# Patient Record
Sex: Male | Born: 1975 | Race: White | Hispanic: No | Smoking: Current some day smoker
Health system: Southern US, Community
[De-identification: ages and names within clinical notes are randomized; demographics above are authoritative.]

## PROBLEM LIST (undated history)

## (undated) DIAGNOSIS — F1020 Alcohol dependence, uncomplicated: Secondary | ICD-10-CM

## (undated) DIAGNOSIS — F429 Obsessive-compulsive disorder, unspecified: Secondary | ICD-10-CM

---

## 2017-11-20 ENCOUNTER — Emergency Department (HOSPITAL_COMMUNITY): Payer: Self-pay

## 2017-11-20 ENCOUNTER — Encounter (HOSPITAL_COMMUNITY): Payer: Self-pay

## 2017-11-20 ENCOUNTER — Emergency Department (HOSPITAL_COMMUNITY)
Admission: EM | Admit: 2017-11-20 | Discharge: 2017-11-20 | Disposition: A | Discharge status: Home / Self Care | Payer: Self-pay | Attending: Emergency Medicine | Admitting: Emergency Medicine

## 2017-11-20 ENCOUNTER — Other Ambulatory Visit: Payer: Self-pay

## 2017-11-20 DIAGNOSIS — R52 Pain, unspecified: Secondary | ICD-10-CM

## 2017-11-20 DIAGNOSIS — R079 Chest pain, unspecified: Secondary | ICD-10-CM | POA: Insufficient documentation

## 2017-11-20 DIAGNOSIS — F17290 Nicotine dependence, other tobacco product, uncomplicated: Secondary | ICD-10-CM | POA: Insufficient documentation

## 2017-11-20 DIAGNOSIS — F99 Mental disorder, not otherwise specified: Secondary | ICD-10-CM | POA: Insufficient documentation

## 2017-11-20 DIAGNOSIS — Z79899 Other long term (current) drug therapy: Secondary | ICD-10-CM | POA: Insufficient documentation

## 2017-11-20 HISTORY — DX: Obsessive-compulsive disorder, unspecified: F42.9

## 2017-11-20 LAB — RAPID URINE DRUG SCREEN, HOSP PERFORMED
AMPHETAMINES: NOT DETECTED
Barbiturates: NOT DETECTED
Benzodiazepines: NOT DETECTED
Cocaine: NOT DETECTED
OPIATES: NOT DETECTED
Tetrahydrocannabinol: NOT DETECTED

## 2017-11-20 LAB — CBC
HCT: 45.2 % (ref 39.0–52.0)
Hemoglobin: 16.2 g/dL (ref 13.0–17.0)
MCH: 31.4 pg (ref 26.0–34.0)
MCHC: 35.8 g/dL (ref 30.0–36.0)
MCV: 87.6 fL (ref 78.0–100.0)
PLATELETS: 200 10*3/uL (ref 150–400)
RBC: 5.16 MIL/uL (ref 4.22–5.81)
RDW: 12.2 % (ref 11.5–15.5)
WBC: 5.8 10*3/uL (ref 4.0–10.5)

## 2017-11-20 LAB — COMPREHENSIVE METABOLIC PANEL
ALK PHOS: 73 U/L (ref 38–126)
ALT: 57 U/L (ref 17–63)
ANION GAP: 11 (ref 5–15)
AST: 69 U/L — ABNORMAL HIGH (ref 15–41)
Albumin: 5 g/dL (ref 3.5–5.0)
BUN: 12 mg/dL (ref 6–20)
CALCIUM: 9.7 mg/dL (ref 8.9–10.3)
CO2: 25 mmol/L (ref 22–32)
Chloride: 101 mmol/L (ref 101–111)
Creatinine, Ser: 1.06 mg/dL (ref 0.61–1.24)
Glucose, Bld: 109 mg/dL — ABNORMAL HIGH (ref 65–99)
Potassium: 3.9 mmol/L (ref 3.5–5.1)
SODIUM: 137 mmol/L (ref 135–145)
TOTAL PROTEIN: 8 g/dL (ref 6.5–8.1)
Total Bilirubin: 1 mg/dL (ref 0.3–1.2)

## 2017-11-20 LAB — ETHANOL: Alcohol, Ethyl (B): <10 mg/dL (ref <10)

## 2017-11-20 LAB — I-STAT TROPONIN, ED
TROPONIN I, POC: 0 ng/mL (ref 0.00–0.08)
Troponin i, poc: 0 ng/mL (ref 0.00–0.08)

## 2017-11-20 LAB — ACETAMINOPHEN LEVEL

## 2017-11-20 LAB — SALICYLATE LEVEL

## 2017-11-20 MED ORDER — LORAZEPAM 1 MG PO TABS: 1.0000 mg | ORAL_TABLET | Freq: Once | ORAL | Status: DC

## 2017-11-20 MED ORDER — GI COCKTAIL ~~LOC~~
30.0000 mL | Freq: Once | ORAL | Status: AC
  Administered 2017-11-20: 30 mL via ORAL
  Filled 2017-11-20 (×2): qty 30

## 2017-11-20 NOTE — ED Notes (Signed)
Pt is requesting to leave. 

## 2017-11-20 NOTE — ED Notes (Signed)
Bed: [facility]4 Expected date:  Expected time:  Means of arrival:  Comments: 

## 2017-11-20 NOTE — ED Notes (Signed)
Bed: [facility]1 Expected date:  Expected time:  Means of arrival:  Comments: 

## 2017-11-20 NOTE — ED Notes (Signed)
Called twice with no answer.

## 2017-11-20 NOTE — ED Provider Notes (Signed)
[facility]-EMERGENCY DEPT Provider Note   CSN: [id number] Arrival date & time: 11/20/17  1229     History   Chief Complaint Chief Complaint  Patient presents with  . Chest Pain  . Paranoid  . Psychiatric Evaluation    HPI Grant Lowery is a 41 y.o. male.  HPI 40 year old Caucasian past medical history significant for depression, OCD, anorexia, alcohol abuse that presents to the emergency department today for evaluation of paranoia and chest pain.  The patient states that over the past several days he is "having a psychotic break".  The patient states that he has not been able to sleep.  He has had poor appetite however this is consistent with his anorexia.  Patient also states that he is convinced that there is something medically wrong with him. He wants to make sure that he does not have cancer or liver cirrhosis.  Patient is paranoid that he is going to die.  Patient does have a history of alcohol abuse however states that he has no recent alcohol use.  According to patient's notes in care everywhere in 2016 he was at a facility for 30 days for inpatient treatment of alcohol abuse.  In 2017 he spent 6 weeks at [facility] in [place] for treatment of eating disorder OCD and substance abuse, in the beginning of 2018 he was seen locally [facility].  Patient reports tobacco use.  Denies any other drug use or alcohol use at this time. States that he is seeing stars on the wall.  States that he is living in 2 dimensions one is the regular world and one is his anorexic world.  Patient states that he is on Vivitrol injections and injectable anorexic medication and was recently placed on Wellbutrin yesterday.  States that he took 1 dose of the Wellbutrin and that he felt this way.  However his friend at bedside states that patient for the past month has been feeling paranoid.  Patient states that he does see a psychiatrist in [place] and they recently started a new  regimen and thinks that this may be what is wrong with him.  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  Patient is aware that he may lose his job. He admitted to working today for 2 hours and feeling like people were talking about him and thinking he was crazy, so he left work early  Patient denies any SI or HI behavior. He denies any other drug use or alcohol use at this time.  Patient also complains of generalized chest pain that started yesterday.  Denies any associated shortness of breath, diaphoresis, nausea, emesis.  The pain is substernal.  Does not radiate.  He has not training for the pain prior to arrival.  Patient denies any cardiac history.  Denies any significant cardiac history.  Denies any history of hypertension or diabetes.  Patient also denies any history of DVT/PE, prolonged immobilization, recent hospitalizations or surgeries, unilateral leg swelling or calf tenderness, hormone use, hemoptysis, history of cancer.  Chest pain is not exertional.  Nothing makes better or worse.  Chest pain is nonpleuritic.  Pt denies any fever, chill, ha, vision changes, lightheadedness, dizziness, congestion, neck pain, sob, cough, abd pain, n/v/d, urinary symptoms, change in bowel habits, melena, hematochezia, lower extremity paresthesias.  Past Medical History:  Diagnosis Date  . OCD (obsessive compulsive disorder)     There are no active problems to display for this patient.  History reviewed. No pertinent surgical history.     Home Medications    Prior to Admission medications   Not on File    Family History Family History  Problem Relation Age of Onset  . Cancer Father     Social History Social History   Tobacco Use  . Smoking status: Current Some Day Smoker    Types: Cigars  . Smokeless tobacco: Never Used  Substance Use Topics  . Alcohol use: No    Frequency: Never  . Drug use: No     Allergies   Patient has no known allergies.   Review  of Systems Review of Systems  Constitutional: Negative for chills and fever.  HENT: Negative for congestion and sore throat.   Eyes: Negative for visual disturbance.  Respiratory: Negative for cough and shortness of breath.   Cardiovascular: Positive for chest pain. Negative for palpitations and leg swelling.  Gastrointestinal: Negative for abdominal pain, diarrhea, nausea and vomiting.  Genitourinary: Negative for dysuria, flank pain, frequency, hematuria, scrotal swelling, testicular pain and urgency.  Musculoskeletal: Negative for arthralgias and myalgias.  Skin: Negative for rash.  Neurological: Negative for dizziness, syncope, weakness, light-headedness, numbness and headaches.  Psychiatric/Behavioral: Positive for agitation, behavioral problems and hallucinations. Negative for sleep disturbance. The patient is nervous/anxious.      Physical Exam Updated Vital Signs BP (!) 157/92 (BP Location: Left Arm)   Pulse 86   Temp 98.5 F (36.9 C) (Oral)   Resp 16   Ht 5\' 10"  (1.778 m)   Wt 72.1 kg (159 lb)   SpO2 100%   BMI 22.81 kg/m   Physical Exam  Constitutional: He is oriented to person, place, and time. He appears well-developed and well-nourished.  Non-toxic appearance. No distress.  HENT:  Head: Normocephalic and atraumatic.  Nose: Nose normal.  Mouth/Throat: Oropharynx is clear and moist.  Eyes: Conjunctivae and EOM are normal. Pupils are equal, round, and reactive to light. Right eye exhibits no discharge. Left eye exhibits no discharge.  Neck: Normal range of motion. Neck supple. No JVD present. No tracheal deviation present.  Cardiovascular: Normal rate, regular rhythm, normal heart sounds and intact distal pulses. Exam reveals no gallop and no friction rub.  No murmur heard. Pulmonary/Chest: Effort normal and breath sounds normal. No stridor. No respiratory distress. He has no wheezes. He has no rales. He exhibits no tenderness.  No hypoxia or tachypnea.  Abdominal:  Soft. Bowel sounds are normal. He exhibits no distension. There is no tenderness. There is no rebound and no guarding.  Musculoskeletal: Normal range of motion.  No lower extremity edema or calf tenderness.  Lymphadenopathy:    He has no cervical adenopathy.  Neurological: He is alert and oriented to person, place, and time.  Strength equal in all extremities.  Alert and oriented x3.  Follows commands appropriately.  Speech is normal.  Skin: Skin is warm and dry. Capillary refill takes less than 2 seconds. He is not diaphoretic.  Psychiatric: Judgment normal. His mood appears anxious. His speech is rapid and/or pressured. He is agitated. Thought content is paranoid. Cognition and memory are normal. He exhibits a depressed mood. He expresses no homicidal and no suicidal ideation. He expresses no suicidal plans and no homicidal plans.  Pt seems anxious with flight of ideas. Denies si or hi. No actively at responding to internal stimuli.  Nursing note and vitals reviewed.    ED Treatments / Results  Labs (all labs ordered are listed, but only abnormal results  are displayed) Labs Reviewed  COMPREHENSIVE METABOLIC PANEL - Abnormal; Notable for the following components:      Result Value   Glucose, Bld 109 (*)    AST 69 (*)    All other components within normal limits  ACETAMINOPHEN LEVEL - Abnormal; Notable for the following components:   Acetaminophen (Tylenol), Serum <10 (*)    All other components within normal limits  CBC  SALICYLATE LEVEL  RAPID URINE DRUG SCREEN, HOSP PERFORMED  ETHANOL  I-STAT TROPONIN, ED  I-STAT TROPONIN, ED    EKG  EKG Interpretation  Date/Time:  Wednesday November 20 2017 13:11:05 EST Ventricular Rate:  92 PR Interval:    QRS Duration: 106 QT Interval:  369 QTC Calculation: 457 R Axis:   53 Text Interpretation:  Sinus rhythm Consider right atrial enlargement Borderline T wave abnormalities Minimal ST elevation, anterior leads Baseline wander in  lead(s) V2 Confirmed by Lorre Nick ([id number]) on 11/20/2017 6:27:46 PM       Radiology Dg Chest 2 View  Result Date: 11/20/2017 CLINICAL DATA:  Intermittent bilateral in midchest pain associated with nausea since last night. No shortness of breath or vomiting. EXAM: CHEST  2 VIEW COMPARISON:  Chest x-ray of November 20 2017 included with a rib series. FINDINGS: The lungs are well-expanded. The lung markings are coarse in the right infrahilar region which appears to correspond with increased density in the retrocardiac region. The left lung is clear. The heart and pulmonary vascularity are normal. The bony thorax and observed portions of the upper abdomen are normal. IMPRESSION: Coarse lung markings in the right lower lobe may reflect atelectasis or early pneumonia. Followup PA and lateral chest X-ray is recommended in 3-4 weeks following trial of antibiotic therapy to ensure resolution and exclude underlying malignancy. (The previously dictated right rib series included a chest x-ray on which the findings in the right infrahilar region were not as prominent but with the density noted on the lateral view in the right lower lobe, an infiltrate is felt likely.) Electronically Signed   By: David  [place] M.D.   On: 11/20/2017 14:41   Dg Ribs Unilateral Left  Result Date: 11/20/2017 CLINICAL DATA:  Intermittent chest discomfort with nausea since last night. No known injury. The patient reports difficulty taking a deep breath. EXAM: LEFT RIBS - 2 VIEW COMPARISON:  None in PACs FINDINGS: The lungs are well-expanded and clear. The heart and pulmonary vascularity are normal. There is no pleural effusion or pneumothorax. Left rib detail images reveal no acute or old rib fracture. The gas pattern in the upper abdomen is normal. IMPRESSION: There is no active cardiopulmonary disease. No abnormality of the left ribs is observed. Electronically Signed   By: David  [place] M.D.   On: 11/20/2017 14:24     Procedures Procedures (including critical care time)  Medications Ordered in ED Medications - No data to display   Initial Impression / Assessment and Plan / ED Course  I have reviewed the triage vital signs and the nursing notes.  Pertinent labs & imaging results that were available during my care of the patient were reviewed by me and considered in my medical decision making (see chart for details).     Patient presents to the ED for psychiatric evaluation and.  Patient has no cardiac history.  Does have a history of anorexia, OCD, depression that is followed by a psychiatrist.  Recently changed to different medications including Wellbutrin.  Patient was at work today and thought he was  having a psychotic break.  Friend at bedside states that patient has not been sleeping over the past week.  He has been staying with his friend in [place].  Patient recently started a new job.  On exam patient is overall well-appearing and nontoxic.  Vital signs are reassuring.  Patient does appear anxious with flight of ideas.  On exam patient has clear lungs to auscultation.  Heart regular rate and rhythm with no rubs murmurs or gallops.  Abdominal exam is benign.  Patient nerve.  No focal neuro deficit.  Patient is alert oriented x3.  Adamantly denies SI or HI behavior.   Lab work has been very reassuring.  No leukocytosis.  Electro lites are reassuring.  Mild elevation in AST of 69 with history of same.  Correlates with patient's history of alcohol abuse.  Salicylate level, Tylenol level are normal.  UDS was unremarkable.  Ethanol level was normal.  EKG shows some nonspecific T wave changes but no signs of acute ischemia.  Negative delta troponin.  Chest x-ray shows concern for atelectasis versus developing infiltrate.  However patient denies any cough productive cough, cough, fevers and has no leukocytosis.  Low suspicion for pneumonia at this time.  Talked with patient we will not treat at this  time.  Will follow up with his primary care doctor or return to the ED if he develops any these symptoms will have repeat chest x-ray in 2 weeks.  Do not feel the patient's chest pain is cardiac or pulmonary in nature.  Patient's heart pathway score is 2.  Negative delta troponins.  Clinical presentation does not seem consistent with ACS.  Patient is PERC negative.  Low suspicion for PE.  Clinical presentation is concerning for pneumonia or dissection.  I did have TTS evaluate patient.  They discussed with patient that he could benefit from overnight stay with reevaluation by psychiatry in the morning.  The patient states that he does want to make sure that his blood work was okay.  States that he felt like he had cancer or liver cirrhosis.  States that he does not want to stay overnight.  TTS talk with psychiatry and did not feel that patient opposed to harm to himself.  I have re-spoken with patient.  He seems more calm at this time.  Discussed all of his blood work are reassuring.  Patient states so I do not have cancer.  I discussed with patient that her blood work does not specifically test for cancer however when I seen so far his blood work has been reassuring.  He does have some mild liver elevation enzymes that is consistent with his alcohol usage.  I discussed the patient that he will need a yearly checkup with his primary care doctor.  Patient does have a friend at bedside who has been staying with patient in [place].  The patient also has a good support system with family in the area.  Patient also sees a psychiatrist in [place] and saw him yesterday when he was started on Wellbutrin.  Again discussed with patient and he adamantly denies SI or HI behavior.  I do not feel the patient poses a harm to himself.  Given that patient has good support system that can help patient out feel that patient is safe for discharge.  Appears significantly improved since prior assessment.  Discussed that he can  return to the ED at any time if he feels the symptoms again.  Patient states he feels much improved.  He discussed with me that he will call a psychiatrist in the morning to follow-up.  Pt is hemodynamically stable, in NAD, & able to ambulate in the ED. Evaluation does not show pathology that would require ongoing emergent intervention or inpatient treatment. I explained the diagnosis to the patient. Pain has been managed & has no complaints prior to dc. Pt is comfortable with above plan and is stable for discharge at this time. All questions were answered prior to disposition. Strict return precautions for f/u to the ED were discussed. Encouraged follow up with PCP.  Dicussed with my attending who is agreeable with the above plan.    Final Clinical Impressions(s) / ED Diagnoses   Final diagnoses:  Chest pain, unspecified type  Psychiatric disturbance    ED Discharge Orders    None       Wallace KellerLeaphart, Shonia Skilling T, PA-C 11/20/17 1934    Rolland PorterJames, Mark, MD 11/21/17 2354

## 2017-11-20 NOTE — Discharge Instructions (Signed)
Your lab work has been reassuring.  Your liver enzymes are elevated however it seems consistent with prior test.  This is from drinking.  We will need to have this followed up with the primary care doctor.  Your chest x-ray shows a possible signs of pneumonia however you have no cough or fever.  Low suspicion for pneumonia.  If you develop a cough that is productive, develop a fever or worsening chest pains return to the ED or follow-up with the primary care doctor.  Have a repeat chest x-ray in 2-3 weeks.  It is very important that you follow-up with your psychiatrist.  If you develop any worsening symptoms make sure you return or have your friend bring it back to the ED for evaluation.

## 2017-11-20 NOTE — BH Assessment (Signed)
Assessment Note  Grant Lowery is a 41 y.o. male who presented voluntarily to ED due to feeling like he was having a psychotic break. Pt reported to the EDP that he was feeling like he was going to die (convinced that something medically was wrong with him) and that people were out to hurt him. Pt also reported having VH of stars on the wall. Pt denied any SI or HI.   During TTS assessment close to 4 hours later, pt presented as calm and cooperative. He admitted to feeling paranoid and thinking he had all sorts of medical issues going on. He admitted to working today for 2 hours and feeling like people were talking about him and thinking he was crazy, so he left work early. Pt shared that he started Wellbutrin a few days ago. No other changes identified. Pt sees a psychiatrist in [place] and reports being med compliant. Pt denied SI and HI. Pt admitted to seeing stars on the walls earlier in the day, but denied seeing them currently. Pt was not amenable to stay in the ED or an IP stay. Pt indicated that if his lab work and x-rays were good, he wanted to be d/c. He indicated that if he started to feel paranoid again, he would re-present to the ED.   Staffed case with Hillery Jacksanika Lewis, NP. It would be beneficial for pt to stay overnight and be observed for stability and be seen by the psychiatrist in the morning. However, pt does not want to stay and, at this point, does not pose a risk to himself or others. It is ultimately up to the EDP to determine if they want to pursue IVC. EDP Leaphart notified of disposition.   Diagnosis: OCD, by hx  Past Medical History:  Past Medical History:  Diagnosis Date  . OCD (obsessive compulsive disorder)     History reviewed. No pertinent surgical history.  Family History:  Family History  Problem Relation Age of Onset  . Cancer Father     Social History:  reports that he has been smoking cigars.  he has never used smokeless tobacco. He reports that he does not  drink alcohol or use drugs.  Additional Social History:  Alcohol / Drug Use Pain Medications: see PTA meds Prescriptions: see PTA meds Over the Counter: see PTA meds History of alcohol / drug use?: No history of alcohol / drug abuse  CIWA: CIWA-Ar BP: (!) 140/92 Pulse Rate: 75 COWS:    Allergies: No Known Allergies  Home Medications:  (Not in a hospital admission)  OB/GYN Status:  No LMP for male patient.  General Assessment Data [facility] of Assessment: [facility] TTS Assessment: In system Is this a Tele or Face-to-Face Assessment?: Face-to-Face Is this an Initial Assessment or a Re-assessment for this encounter?: Initial Assessment Marital status: Divorced Living Arrangements: Other (Comment)([facility]) Can pt return to current living arrangement?: Yes Admission Status: Voluntary Is patient capable of signing voluntary admission?: Yes Referral Source: Self/Family/Friend     Crisis Care Plan Living Arrangements: Other (Comment)([facility]) Name of Psychiatrist: a provider in [place]  Education Status Is patient currently in school?: No  Risk to self with the past 6 months Suicidal Ideation: No Has patient been a risk to self within the past 6 months prior to admission? : No Suicidal Intent: No Has patient had any suicidal intent within the past 6 months prior to admission? : No Is patient at risk for suicide?: No Suicidal Plan?: No Has patient had  any suicidal plan within the past 6 months prior to admission? : No Access to Means: No Previous Attempts/Gestures: No Intentional Self Injurious Behavior: None Family Suicide History: Unknown Recent stressful life event(s): Other (Comment) Persecutory voices/beliefs?: No Depression: No Substance abuse history and/or treatment for substance abuse?: No Suicide prevention information given to non-admitted patients: Not applicable  Risk to Others within the past 6 months Homicidal Ideation: No Does patient have any  lifetime risk of violence toward others beyond the six months prior to admission? : No Thoughts of Harm to Others: No Current Homicidal Intent: No Current Homicidal Plan: No Access to Homicidal Means: No History of harm to others?: No Assessment of Violence: None Noted Does patient have access to weapons?: No Criminal Charges Pending?: No Does patient have a court date: No Is patient on probation?: No  Psychosis Hallucinations: Visual Delusions: None noted  Mental Status Report Appearance/Hygiene: Unremarkable Eye Contact: Good Motor Activity: Unremarkable Speech: Logical/coherent Level of Consciousness: Alert Mood: Pleasant Affect: Blunted Anxiety Level: Minimal Thought Processes: Coherent, Relevant Judgement: Partial Orientation: Person, Place, Time, Situation Obsessive Compulsive Thoughts/Behaviors: Unable to Assess  Cognitive Functioning Concentration: Normal Memory: Recent Impaired, Remote Impaired IQ: Average Insight: see judgement above Impulse Control: Unable to Assess Appetite: Fair Sleep: Decreased Total Hours of Sleep: 4 Vegetative Symptoms: None  ADLScreening [facility] Assessment Services) Patient's cognitive ability adequate to safely complete daily activities?: Yes Patient able to express need for assistance with ADLs?: Yes Independently performs ADLs?: Yes (appropriate for developmental age)  Prior Inpatient Therapy Prior Inpatient Therapy: No  Prior Outpatient Therapy Prior Outpatient Therapy: No Does patient have an ACCT team?: No Does patient have Intensive In-House Services?  : No Does patient have [facility] services? : No Does patient have P4CC services?: No  ADL Screening (condition at time of admission) Patient's cognitive ability adequate to safely complete daily activities?: Yes Is the patient deaf or have difficulty hearing?: No Does the patient have difficulty seeing, even when wearing glasses/contacts?: No Does the patient have  difficulty concentrating, remembering, or making decisions?: No Patient able to express need for assistance with ADLs?: Yes Does the patient have difficulty dressing or bathing?: No Independently performs ADLs?: Yes (appropriate for developmental age) Does the patient have difficulty walking or climbing stairs?: No Weakness of Legs: None Weakness of Arms/Hands: None  Home Assistive Devices/Equipment Home Assistive Devices/Equipment: None    Abuse/Neglect Assessment (Assessment to be complete while patient is alone) Abuse/Neglect Assessment Can Be Completed: Yes Physical Abuse: Denies Verbal Abuse: Denies Sexual Abuse: Denies Exploitation of patient/patient's resources: Denies Self-Neglect: Denies Values / Beliefs Cultural Requests During Hospitalization: None Spiritual Requests During Hospitalization: None   Advance Directives (For Healthcare) Does Patient Have a Medical Advance Directive?: No Would patient like information on creating a medical advance directive?: No - Patient declined Nutrition Screen- [facility] Patient's home diet: Regular Has the patient recently lost weight without trying?: Yes, 14-23 lbs. Has the patient been eating poorly because of a decreased appetite?: Yes Malnutrition Screening Tool Score: 3  Additional Information 1:1 In Past 12 Months?: No CIRT Risk: No Elopement Risk: No Does patient have medical clearance?: Yes     Disposition:  Disposition Initial Assessment Completed for this Encounter: Yes(consulted with Hillery Jacksanika Lewis, NP) Disposition of Patient: Other dispositions, Re-evaluation by Psychiatry recommended(Overnight observation for stability recommended.)  On Site Evaluation by:   Reviewed with Physician:    Laddie AquasSamantha M Osmin Welz 11/20/2017 6:33 PM

## 2017-11-20 NOTE — ED Triage Notes (Signed)
Patient c/ointermittent mid chest pain and nausea since last night. Patient denies SOB, diaphoresis or vomiting.  Patient states he needs a psych evaluation because he is feeling paranoid and feels like he is going to die. Patient states he feels like he is having multiple personalities, but has never been diagnosed with that. Patient also c/o visual hallucinations like stars. Patient denies any auditory hallucinations. Patient denies any alcohol or drug use. Patient denies SI/HI

## 2017-11-20 NOTE — ED Notes (Signed)
TTS in room talking with pt.

## 2018-04-26 IMAGING — CR DG RIBS 2V*L*
5 series · 5 of 5 positions shown · non-contrast
Comparison: None in PACs

CLINICAL DATA: Intermittent chest discomfort with nausea since last
night. No known injury. The patient reports difficulty taking a deep
breath.

EXAM:
LEFT RIBS - 2 VIEW

[w chest pa]
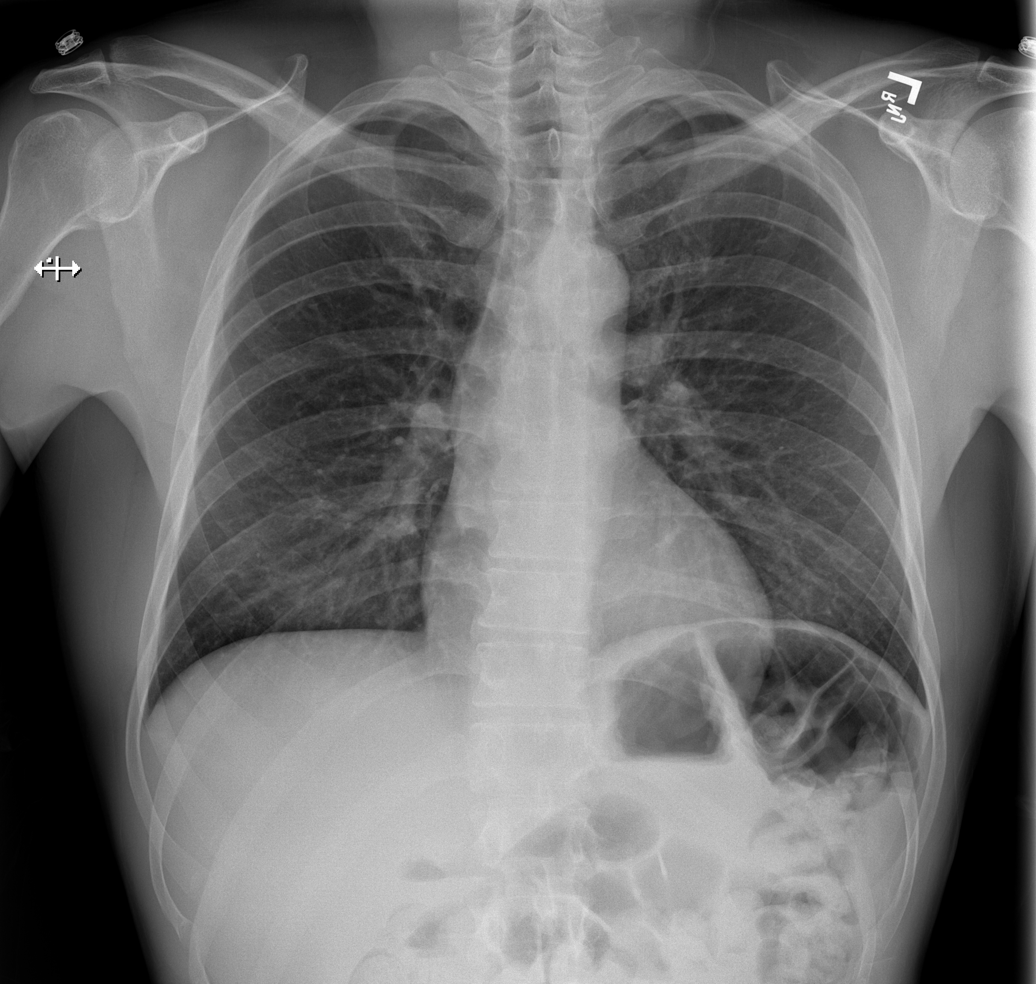

[w ribs ap upper left]
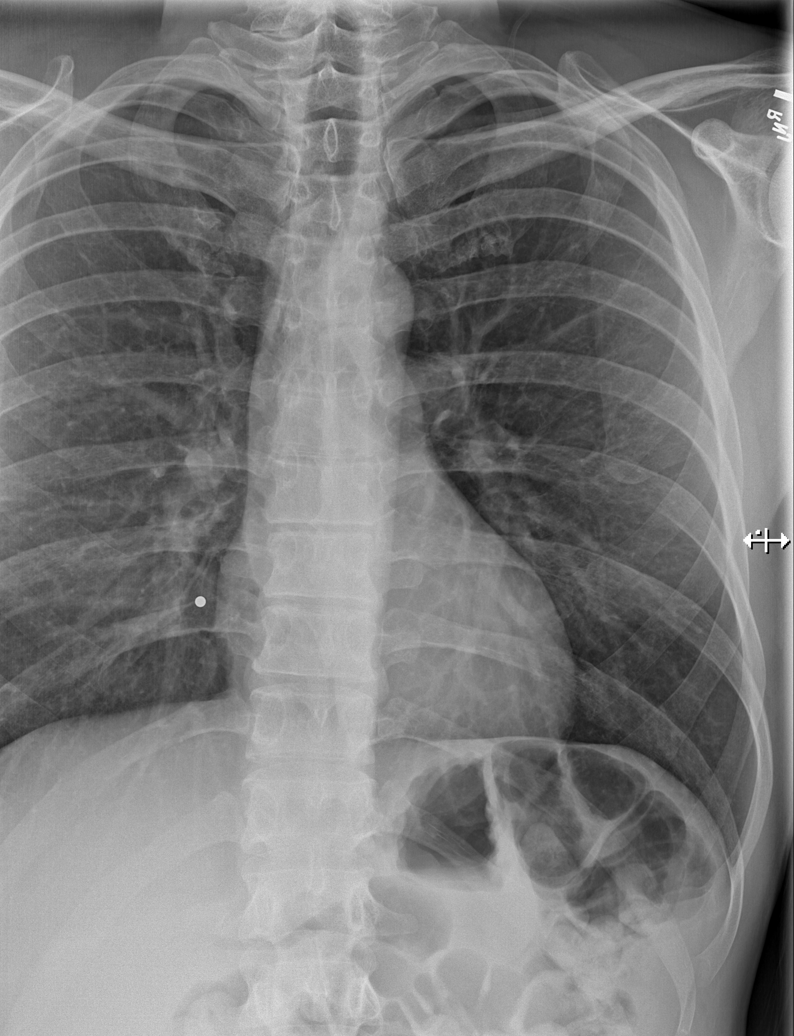

[w ribs ap lower left]
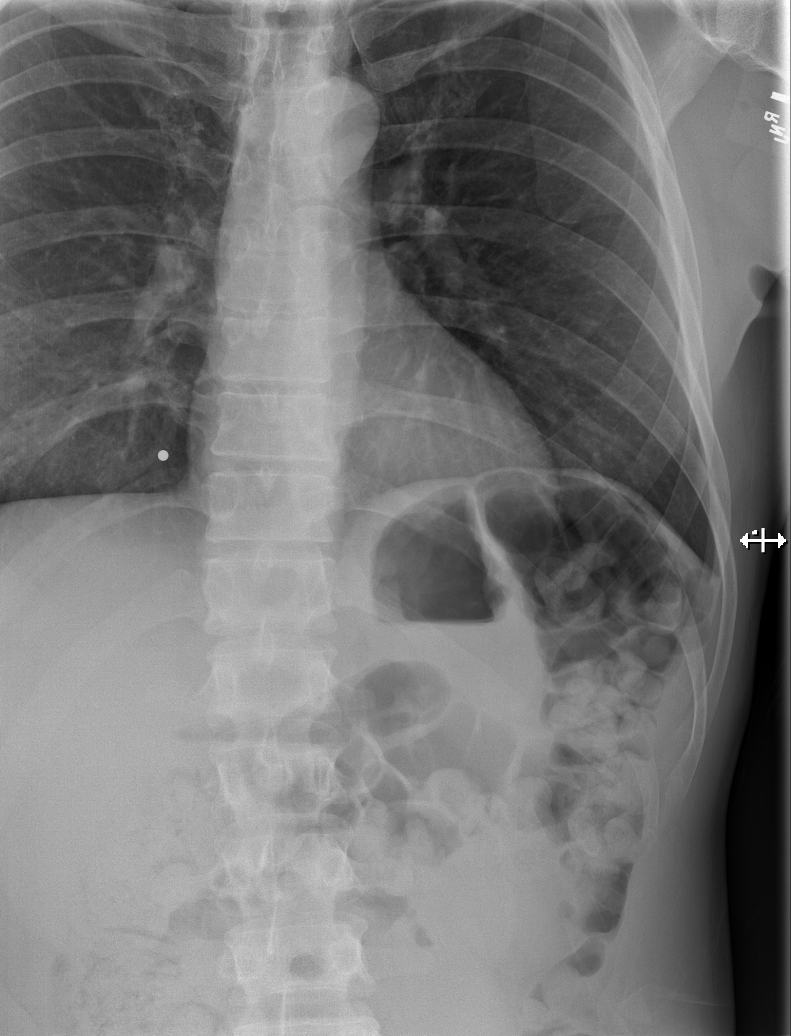

[w ribs obl left (1 of 2)]
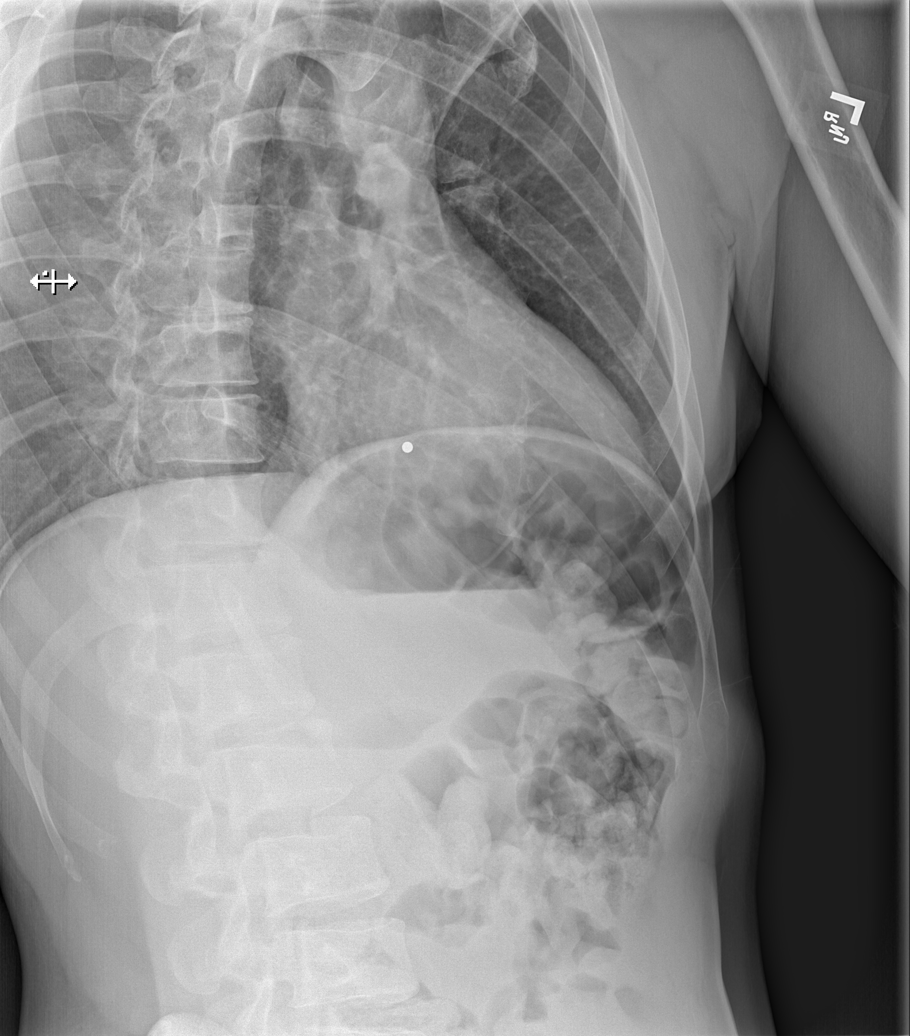

[w ribs obl left (2 of 2)]
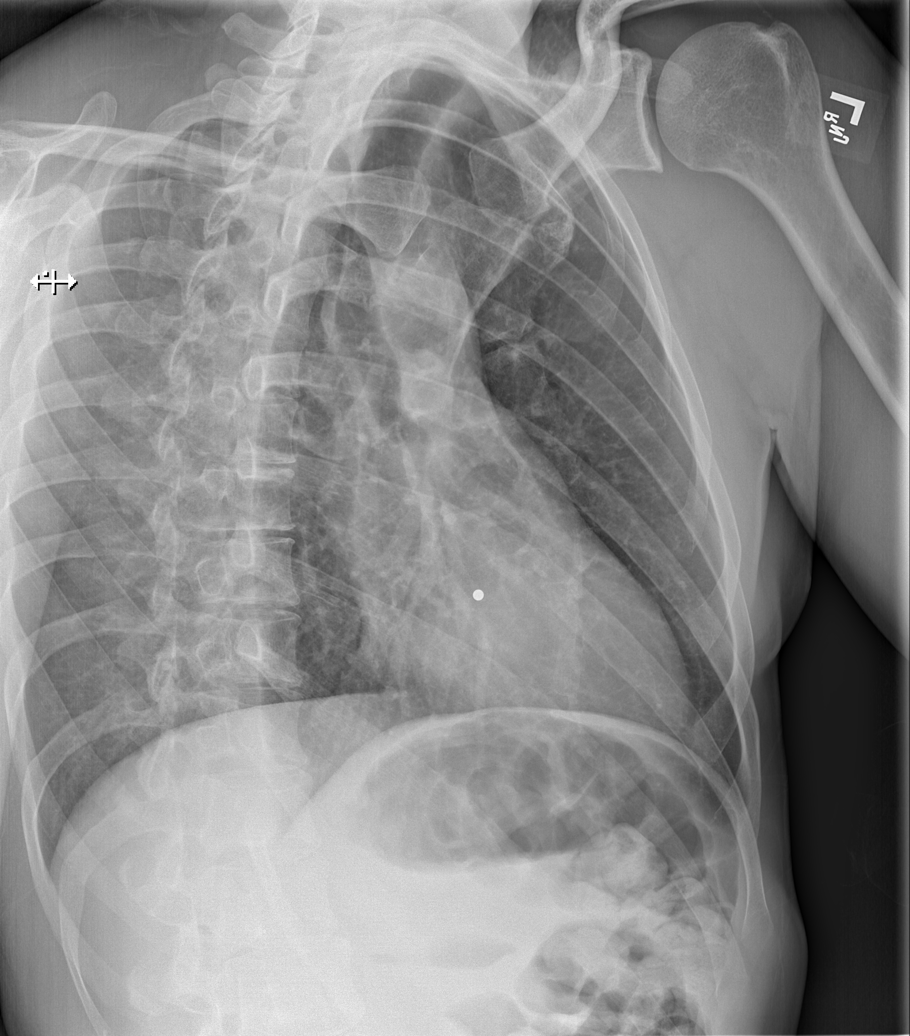

[5 of 5 positions shown; findings below may reference images not displayed]

FINDINGS: The lungs are well-expanded and clear. The heart and pulmonary
vascularity are normal. There is no pleural effusion or
pneumothorax. Left rib detail images reveal no acute or old rib
fracture. The gas pattern in the upper abdomen is normal.
IMPRESSION: There is no active cardiopulmonary disease. No abnormality of the
left ribs is observed.

## 2023-07-16 ENCOUNTER — Other Ambulatory Visit: Payer: Self-pay

## 2023-07-16 ENCOUNTER — Emergency Department (HOSPITAL_COMMUNITY)
Admission: EM | Admit: 2023-07-16 | Discharge: 2023-07-18 | Disposition: A | Discharge status: Other Acute Inpatient Hospital | Payer: BC Managed Care – PPO | Attending: Emergency Medicine | Admitting: Emergency Medicine

## 2023-07-16 ENCOUNTER — Encounter (HOSPITAL_COMMUNITY): Payer: Self-pay

## 2023-07-16 ENCOUNTER — Emergency Department (HOSPITAL_COMMUNITY): Payer: BC Managed Care – PPO

## 2023-07-16 DIAGNOSIS — F1020 Alcohol dependence, uncomplicated: Secondary | ICD-10-CM | POA: Diagnosis present

## 2023-07-16 DIAGNOSIS — R45851 Suicidal ideations: Secondary | ICD-10-CM | POA: Diagnosis not present

## 2023-07-16 HISTORY — DX: Alcohol dependence, uncomplicated: F10.20

## 2023-07-16 LAB — CBC WITH DIFFERENTIAL/PLATELET
Abs Immature Granulocytes: 0 10*3/uL (ref 0.00–0.07)
Basophils Absolute: 0 10*3/uL (ref 0.0–0.1)
Basophils Relative: 1 %
Eosinophils Absolute: 0 10*3/uL (ref 0.0–0.5)
Eosinophils Relative: 0 %
HCT: 45.9 % (ref 39.0–52.0)
Hemoglobin: 15.4 g/dL (ref 13.0–17.0)
Immature Granulocytes: 0 %
Lymphocytes Relative: 31 %
Lymphs Abs: 1.1 10*3/uL (ref 0.7–4.0)
MCH: 30.1 pg (ref 26.0–34.0)
MCHC: 33.6 g/dL (ref 30.0–36.0)
MCV: 89.6 fL (ref 80.0–100.0)
Monocytes Absolute: 0.4 10*3/uL (ref 0.1–1.0)
Monocytes Relative: 11 %
Neutro Abs: 2.1 10*3/uL (ref 1.7–7.7)
Neutrophils Relative %: 57 %
Platelets: 186 10*3/uL (ref 150–400)
RBC: 5.12 MIL/uL (ref 4.22–5.81)
RDW: 12.1 % (ref 11.5–15.5)
WBC: 3.7 10*3/uL — ABNORMAL LOW (ref 4.0–10.5)
nRBC: 0 % (ref 0.0–0.2)

## 2023-07-16 LAB — RAPID URINE DRUG SCREEN, HOSP PERFORMED
Amphetamines: NOT DETECTED
Barbiturates: NOT DETECTED
Benzodiazepines: POSITIVE — AB
Cocaine: NOT DETECTED
Opiates: NOT DETECTED
Tetrahydrocannabinol: NOT DETECTED

## 2023-07-16 LAB — ETHANOL: Alcohol, Ethyl (B): <10 mg/dL (ref <10)

## 2023-07-16 LAB — COMPREHENSIVE METABOLIC PANEL
ALT: 19 U/L (ref 0–44)
AST: 21 U/L (ref 15–41)
Albumin: 4.9 g/dL (ref 3.5–5.0)
Alkaline Phosphatase: 49 U/L (ref 38–126)
Anion gap: 7 (ref 5–15)
BUN: 13 mg/dL (ref 6–20)
CO2: 29 mmol/L (ref 22–32)
Calcium: 9.3 mg/dL (ref 8.9–10.3)
Chloride: 97 mmol/L — ABNORMAL LOW (ref 98–111)
Creatinine, Ser: 1.02 mg/dL (ref 0.61–1.24)
GFR, Estimated: >60 mL/min (ref >60)
Glucose, Bld: 92 mg/dL (ref 70–99)
Potassium: 3.9 mmol/L (ref 3.5–5.1)
Sodium: 133 mmol/L — ABNORMAL LOW (ref 135–145)
Total Bilirubin: 1.1 mg/dL (ref 0.3–1.2)
Total Protein: 7.3 g/dL (ref 6.5–8.1)

## 2023-07-16 MED ORDER — FLUVOXAMINE MALEATE 50 MG PO TABS
50.0000 mg | ORAL_TABLET | Freq: Three times a day (TID) | ORAL | Status: DC
  Administered 2023-07-16 – 2023-07-17 (×5): 50 mg via ORAL
  Filled 2023-07-16 (×6): qty 1

## 2023-07-16 MED ORDER — QUETIAPINE FUMARATE 100 MG PO TABS
100.0000 mg | ORAL_TABLET | Freq: Every evening | ORAL | Status: DC | PRN
  Administered 2023-07-16 – 2023-07-17 (×2): 100 mg via ORAL
  Filled 2023-07-16 (×2): qty 1

## 2023-07-16 MED ORDER — NAPROXEN 500 MG PO TABS
500.0000 mg | ORAL_TABLET | Freq: Two times a day (BID) | ORAL | Status: DC
  Administered 2023-07-16 – 2023-07-17 (×3): 500 mg via ORAL
  Filled 2023-07-16 (×3): qty 1

## 2023-07-16 MED ORDER — HYDROXYZINE HCL 10 MG PO TABS
10.0000 mg | ORAL_TABLET | Freq: Three times a day (TID) | ORAL | Status: DC | PRN
  Administered 2023-07-16 – 2023-07-17 (×2): 10 mg via ORAL
  Filled 2023-07-16 (×2): qty 1

## 2023-07-16 MED ORDER — ACETAMINOPHEN 325 MG PO TABS
650.0000 mg | ORAL_TABLET | Freq: Three times a day (TID) | ORAL | Status: DC | PRN
  Administered 2023-07-17: 650 mg via ORAL
  Filled 2023-07-16: qty 2

## 2023-07-16 MED ORDER — THIAMINE MONONITRATE 100 MG PO TABS
100.0000 mg | ORAL_TABLET | Freq: Every day | ORAL | Status: DC
  Administered 2023-07-16 – 2023-07-17 (×2): 100 mg via ORAL
  Filled 2023-07-16 (×2): qty 1

## 2023-07-16 NOTE — ED Notes (Signed)
Pt noted by officer to be licking hand sanitizer out of his hand prior to entering bathroom. Pt educated on expected behavior and ability to consult with provider if medications would possibly be indicated for any treatment or s/s.

## 2023-07-16 NOTE — ED Provider Notes (Signed)
[place] EMERGENCY DEPARTMENT AT [facility] Provider Note   CSN: [id number] Arrival date & time: 07/16/23  1102     History  Chief Complaint  Patient presents with   IVC, suicidal    Grant Lowery is a 47 y.o. male.  47 year old male here today for reported suicidal ideation at his inpatient alcohol [facility] tox facility.  Patient reportedly was asked if he was having thoughts of self-harm and the patient said that he was at that time.  Patient says that he sometimes has thoughts of self-harm.  He is also complaining of chronic pain in his left ankle.  Patient has not had alcohol for more than 10 days.        Home Medications Prior to Admission medications   Medication Sig Start Date End Date Taking? Authorizing Provider  fluvoxaMINE (LUVOX) 50 MG tablet Take 50 mg by mouth 3 (three) times daily. 07/15/23  Yes [provider]  GNP PAIN RELIEF 325 MG tablet Take 650 mg by mouth 3 (three) times daily as needed. 07/10/23  Yes [provider]  naproxen (NAPROSYN) 500 MG tablet SMARTSIG:1 Tablet(s) By Mouth Morning-Night 07/12/23  Yes [provider]  QUEtiapine (SEROQUEL) 100 MG tablet Take 100 mg by mouth at bedtime as needed. 07/12/23  Yes [provider]  thiamine (VITAMIN B1) 100 MG tablet Take 1 tablet by mouth daily. 11/22/17  Yes [provider]  buPROPion (WELLBUTRIN SR) 150 MG 12 hr tablet Take 150 mg by mouth 2 (two) times daily.    [provider]  cariprazine (VRAYLAR) capsule Take 3 mg by mouth daily.    [provider]  FLUoxetine (PROZAC) 40 MG capsule Take 40 mg by mouth daily. 03/07/17   [provider]  levothyroxine (LEVOXYL) 150 MCG tablet Take 150 mcg by mouth 2 (two) times daily.    [provider]  Multiple Vitamin (MULTIVITAMIN WITH MINERALS) TABS tablet Take 1 tablet by mouth daily.    [provider]  Naltrexone (VIVITROL IM) Inject 1 Dose into the muscle once.     [provider]      Allergies    Patient has no known allergies.    Review of Systems   Review of Systems  Physical Exam Updated Vital Signs BP (!) 142/103 (BP Location: Left Arm)   Pulse 62   Temp 98.1 F (36.7 C) (Oral)   Resp 18   Ht 5\' 10"  (1.778 m)   Wt 75.8 kg   SpO2 100%   BMI 23.98 kg/m  Physical Exam Vitals reviewed.  Constitutional:      Appearance: Normal appearance.  Eyes:     Pupils: Pupils are equal, round, and reactive to light.  Cardiovascular:     Rate and Rhythm: Normal rate.  Pulmonary:     Effort: Pulmonary effort is normal.  Neurological:     General: No focal deficit present.     Mental Status: He is alert.  Psychiatric:        Mood and Affect: Mood normal.        Behavior: Behavior normal.        Thought Content: Thought content normal.     Comments: Patient endorses suicidal ideation without plan     ED Results / Procedures / Treatments   Labs (all labs ordered are listed, but only abnormal results are displayed) Labs Reviewed  COMPREHENSIVE METABOLIC PANEL - Abnormal; Notable for the following components:      Result Value  Sodium 133 (*)    Chloride 97 (*)    All other components within normal limits  RAPID URINE DRUG SCREEN, HOSP PERFORMED - Abnormal; Notable for the following components:   Benzodiazepines POSITIVE (*)    All other components within normal limits  CBC WITH DIFFERENTIAL/PLATELET - Abnormal; Notable for the following components:   WBC 3.7 (*)    All other components within normal limits  ETHANOL    EKG None  Radiology DG Ankle 2 Views Left  Result Date: 07/16/2023 CLINICAL DATA:  [facility] of [facility] ideations, being placed under an IVC via the rehab facility Dr. Rock Nephew also c/o lt ankle pain requesting imaging. Gait steady. EXAM: LEFT ANKLE - 2 VIEW COMPARISON:  None available FINDINGS: Multiple well-corticated ossific fragments of the tip of the medial and lateral  malleoli likely related to prior trauma. Advanced degenerative changes of the ankle. Pes planus deformity is partially visualized. No acute fracture or dislocation. Mild soft tissue swelling overlying the lateral malleolus. IMPRESSION: 1. Advanced degenerative changes of the ankle, likely sequelae of prior trauma. 2. Ankle joint effusion is present. Mild soft tissue swelling overlying the lateral malleolus. Electronically Signed   By: Acquanetta Belling M.D.   On: 07/16/2023 12:41    Procedures Procedures    Medications Ordered in ED Medications  hydrOXYzine (ATARAX) tablet 10 mg (10 mg Oral Given 07/16/23 1627)  fluvoxaMINE (LUVOX) tablet 50 mg (has no administration in time range)  QUEtiapine (SEROQUEL) tablet 100 mg (has no administration in time range)  thiamine (VITAMIN B1) tablet 100 mg (100 mg Oral Given 07/16/23 1627)  naproxen (NAPROSYN) tablet 500 mg (500 mg Oral Given 07/16/23 1627)  acetaminophen (TYLENOL) tablet 650 mg (has no administration in time range)    ED Course/ Medical Decision Making/ A&P                                 Medical Decision Making 47 year old male here today for suicidal ideation.  Plan-will medically clear the patient, will refer for TTS.  Patient states that he is not quite happy in the current detox facility that he is in, and is hoping for placement elsewhere.  Reassessment-patient is medically cleared.  Referred to TTS.  Amount and/or Complexity of Data Reviewed Labs: ordered. Radiology: ordered.  Risk OTC drugs. Prescription drug management.           Final Clinical Impression(s) / ED Diagnoses Final diagnoses:  Suicidal ideation    Rx / DC Orders ED Discharge Orders     None         Arletha Pili, DO 07/16/23 1628

## 2023-07-16 NOTE — ED Triage Notes (Addendum)
[facility] for reports of suicidal ideations, being placed under an IVC via the rehab facility Dr. Rock Nephew alo c/o lt ankle pain requesting imaging. Gait steady.

## 2023-07-16 NOTE — Consult Note (Signed)
BH ED ASSESSMENT   Reason for Consult:  Psychiatry evaluation note Referring Physician:  ER Physician Patient Identification: Grant Lowery MRN:  [id number] ED Chief Complaint: Alcohol use disorder, severe, dependence (HCC)  Diagnosis:  Principal Problem:   Alcohol use disorder, severe, dependence (HCC) Active Problems:   Suicide ideation   ED Assessment Time Calculation: Start Time: 1530 Stop Time: 1604 Total Time in Minutes (Assessment Completion): 34   Subjective:   Grant Lowery is a 47 y.o. male patient admitted with Previous Long  hx of Alcoholism, OCD and anxiety.brought in by [employer] under IVC from [facility] for reports of feeling suicidal.  Patient was at the [facility] for 10 days before this happened today.  He went in there to detox from Alcohol and Benzodiazepine.  While waiting to be seen Officer saw patient go in and out of the bathroom on few occasions and she caught patient using hand sanitizer licking his hand.  HPI:  Patient was seen by this provider this afternoon and he was calm and cooperative and engaged in meaningful conversation.He admitted long hx of alcoholism and added he has been to multiple Rehab facilities.  He also states he is addicted to the Benzodiazepine he is given for withdrawal symptoms.  He was IVC by the PA at [facility] when he voiced suicide ideation.  But here in the ER he states he was misunderstood.  He said he had told the PA that he gets suicidal ideation in the past off and on.  He denied he did not say at present he is suicidal.  Patient is homeless, unemployed, lost his house and car due to Alcoholism reported by Emergency planning/management officer who brought him.  Patient states he used to live in [place], [place]. His issue with alcohol started back in 2017 and he has been to many places and states for rehabilitation and treatment Patient currently is unemployed and homeless.  He is minimizing his drinking  issue and states that he plans to move to [place] to his sister's place or to his Parents in [place].  He takes Luvox for his OCD he says.  He was taking Seroquel for sleep but it appears he is out of that and the prescriber is in [place].  Patient reports that he sleeps well and appetite varies.  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 Caucasian male, 47 years old who appears younger than stated age was this this afternoon, came from [facility] for suicidal ideation.  He denies feeling suicidal and states he has no reason to be under IVC.  He already spent 10 days at the facility but here in the ER he was caught coming out of the bathroom licking his hand after multiple trips to the bathroom taking hand sanitizer. Call to his mother was not answered.  Call to [facility] was not answered.. Past Psychiatric History: Previous Long  hx of Alcoholism, OCD and anxiety.  Alcohol Rehab treatment utilized several states and facilities.  Patient denies inpatient Psychiatry hospitalization but has had enough rehab treatment.  We will resume his home Medications and seek bed placement.  Risk to Self or Others: Is the patient at risk to self? Yes Has the patient been a risk to self in the past 6 months? No Has the patient been a risk to self within the distant past? Yes Is the patient a risk to others? No Has the patient been a risk to others  in the past 6 months? No Has the patient been a risk to others within the distant past? No  [place] Scale:  Flowsheet Row ED from 07/16/2023 in [facility] Emergency Department at [facility]  C-SSRS RISK CATEGORY High Risk       AIMS:  , , ,  ,   ASAM:    Substance Abuse:     Past Medical History:  Past Medical History:  Diagnosis Date   OCD (obsessive compulsive disorder)    No past surgical history on file. Family History:  Family History  Problem Relation Age of Onset   Cancer Father    Family Psychiatric   History: denies Social History:  Social History   Substance and Sexual Activity  Alcohol Use No     Social History   Substance and Sexual Activity  Drug Use No    Social History   Socioeconomic History   Marital status: Divorced    Spouse name: Not on file   Number of children: Not on file   Years of education: Not on file   Highest education level: Not on file  Occupational History   Not on file  Tobacco Use   Smoking status: Some Days    Types: Cigars   Smokeless tobacco: Never  Vaping Use   Vaping status: Never Used  Substance and Sexual Activity   Alcohol use: No   Drug use: No   Sexual activity: Not on file  Other Topics Concern   Not on file  Social History Narrative   Not on file   Social Determinants of Health   Financial Resource Strain: Not on file  Food Insecurity: Not on file  Transportation Needs: Not on file  Physical Activity: Not on file  Stress: Not on file  Social Connections: Not on file   Additional Social History:    Allergies:  No Known Allergies  Labs:  Results for orders placed or performed during the hospital encounter of 07/16/23 (from the past 48 hour(s))  Comprehensive metabolic panel     Status: Abnormal   Collection Time: 07/16/23 12:20 PM  Result Value Ref Range   Sodium 133 (L) 135 - 145 mmol/L   Potassium 3.9 3.5 - 5.1 mmol/L   Chloride 97 (L) 98 - 111 mmol/L   CO2 29 22 - 32 mmol/L   Glucose, Bld 92 70 - 99 mg/dL    Comment: Glucose reference range applies only to samples taken after fasting for at least 8 hours.   BUN 13 6 - 20 mg/dL   Creatinine, Ser 0.86 0.61 - 1.24 mg/dL   Calcium 9.3 8.9 - 57.8 mg/dL   Total Protein 7.3 6.5 - 8.1 g/dL   Albumin 4.9 3.5 - 5.0 g/dL   AST 21 15 - 41 U/L   ALT 19 0 - 44 U/L   Alkaline Phosphatase 49 38 - 126 U/L   Total Bilirubin 1.1 0.3 - 1.2 mg/dL   GFR, Estimated >46 >96 mL/min    Comment: (NOTE) Calculated using the CKD-EPI Creatinine Equation (2021)    Anion gap 7 5 -  15    Comment: Performed at [facility], [address], [place], [place] [address]  Ethanol     Status: None   Collection Time: 07/16/23 12:20 PM  Result Value Ref Range   Alcohol, Ethyl (B) <10 <10 mg/dL    Comment: (NOTE) Lowest detectable limit for serum alcohol is 10 mg/dL.  For medical purposes only. Performed  at [facility], [address] [place], [place] [address]   CBC with Diff     Status: Abnormal   Collection Time: 07/16/23 12:20 PM  Result Value Ref Range   WBC 3.7 (L) 4.0 - 10.5 K/uL   RBC 5.12 4.22 - 5.81 MIL/uL   Hemoglobin 15.4 13.0 - 17.0 g/dL   HCT 60.4 54.0 - 98.1 %   MCV 89.6 80.0 - 100.0 fL   MCH 30.1 26.0 - 34.0 pg   MCHC 33.6 30.0 - 36.0 g/dL   RDW 19.1 47.8 - 29.5 %   Platelets 186 150 - 400 K/uL   nRBC 0.0 0.0 - 0.2 %   Neutrophils Relative % 57 %   Neutro Abs 2.1 1.7 - 7.7 K/uL   Lymphocytes Relative 31 %   Lymphs Abs 1.1 0.7 - 4.0 K/uL   Monocytes Relative 11 %   Monocytes Absolute 0.4 0.1 - 1.0 K/uL   Eosinophils Relative 0 %   Eosinophils Absolute 0.0 0.0 - 0.5 K/uL   Basophils Relative 1 %   Basophils Absolute 0.0 0.0 - 0.1 K/uL   Immature Granulocytes 0 %   Abs Immature Granulocytes 0.00 0.00 - 0.07 K/uL    Comment: Performed at [facility], [address] [place], [place] [address]  Urine rapid drug screen (hosp performed)     Status: Abnormal   Collection Time: 07/16/23 12:43 PM  Result Value Ref Range   Opiates NONE DETECTED NONE DETECTED   Cocaine NONE DETECTED NONE DETECTED   Benzodiazepines POSITIVE (A) NONE DETECTED   Amphetamines NONE DETECTED NONE DETECTED   Tetrahydrocannabinol NONE DETECTED NONE DETECTED   Barbiturates NONE DETECTED NONE DETECTED    Comment: (NOTE) DRUG SCREEN FOR MEDICAL PURPOSES ONLY.  IF CONFIRMATION IS NEEDED FOR ANY PURPOSE, NOTIFY LAB WITHIN 5 DAYS.  LOWEST DETECTABLE LIMITS FOR URINE DRUG SCREEN Drug Class                     Cutoff  (ng/mL) Amphetamine and metabolites    1000 Barbiturate and metabolites    200 Benzodiazepine                 200 Opiates and metabolites        300 Cocaine and metabolites        300 THC                            50 Performed at [facility], [address] [place], [place] [address]     Current Facility-Administered Medications  Medication Dose Route Frequency Provider Last Rate Last Admin   acetaminophen (TYLENOL) tablet 650 mg  650 mg Oral TID PRN Anders Simmonds T, DO       fluvoxaMINE (LUVOX) tablet 50 mg  50 mg Oral TID Anders Simmonds T, DO       hydrOXYzine (ATARAX) tablet 10 mg  10 mg Oral TID PRN Anders Simmonds T, DO       naproxen (NAPROSYN) tablet 500 mg  500 mg Oral BID WC Anders Simmonds T, DO       QUEtiapine (SEROQUEL) tablet 100 mg  100 mg Oral QHS PRN Anders Simmonds T, DO       thiamine (VITAMIN B1) tablet 100 mg  100 mg Oral Daily Arletha Pili, DO       Current Outpatient Medications  Medication Sig Dispense Refill   fluvoxaMINE (  LUVOX) 50 MG tablet Take 50 mg by mouth 3 (three) times daily.     GNP PAIN RELIEF 325 MG tablet Take 650 mg by mouth 3 (three) times daily as needed.     naproxen (NAPROSYN) 500 MG tablet SMARTSIG:1 Tablet(s) By Mouth Morning-Night     QUEtiapine (SEROQUEL) 100 MG tablet Take 100 mg by mouth at bedtime as needed.     thiamine (VITAMIN B1) 100 MG tablet Take 1 tablet by mouth daily.     buPROPion (WELLBUTRIN SR) 150 MG 12 hr tablet Take 150 mg by mouth 2 (two) times daily.     cariprazine (VRAYLAR) capsule Take 3 mg by mouth daily.     FLUoxetine (PROZAC) 40 MG capsule Take 40 mg by mouth daily.     levothyroxine (LEVOXYL) 150 MCG tablet Take 150 mcg by mouth 2 (two) times daily.     Multiple Vitamin (MULTIVITAMIN WITH MINERALS) TABS tablet Take 1 tablet by mouth daily.     Naltrexone (VIVITROL IM) Inject 1 Dose into the muscle once.      Musculoskeletal: Strength & Muscle Tone: within normal limits Gait &  Station: normal Patient leans: Front   Psychiatric Specialty Exam: Presentation  General Appearance:  Disheveled; Casual  Eye Contact: Good  Speech: Clear and Coherent; Normal Rate  Speech Volume: Normal  Handedness: Right   Mood and Affect  Mood: Anxious  Affect: Congruent   Thought Process  Thought Processes: Coherent; Goal Directed  Descriptions of Associations:Intact  Orientation:Full (Time, Place and Person)  Thought Content:Logical  History of Schizophrenia/Schizoaffective disorder:No data recorded Duration of Psychotic Symptoms:No data recorded Hallucinations:Hallucinations: None  Ideas of Reference:None  Suicidal Thoughts:Suicidal Thoughts: No  Homicidal Thoughts:Homicidal Thoughts: No   Sensorium  Memory: Immediate Good; Recent Good; Remote Good  Judgment: Fair  Insight: Fair   Art therapist  Concentration: Good  Attention Span: Good  Recall: Good  Fund of Knowledge: Good  Language: Good   Psychomotor Activity  Psychomotor Activity: Psychomotor Activity: Normal   Assets  Assets: Communication Skills; Desire for Improvement; Physical Health    Sleep  Sleep: Sleep: Fair   Physical Exam: Physical Exam Vitals and nursing note reviewed.  HENT:     Nose: Nose normal.  Cardiovascular:     Rate and Rhythm: Normal rate and regular rhythm.  Pulmonary:     Effort: Pulmonary effort is normal.  Musculoskeletal:     Cervical back: Normal range of motion.     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  Skin:    General: Skin is dry.  Neurological:     Mental Status: He is alert and oriented to person, place, and time.  Psychiatric:        Attention and Perception: Attention and perception normal.        Mood and Affect: Mood is anxious and depressed.        Speech: Speech normal.        Behavior: Behavior normal. Behavior is cooperative.        Thought Content: Thought content includes suicidal  ideation.        Cognition and Memory: Cognition and memory normal.        Judgment: Judgment is impulsive and inappropriate.    Review of Systems  Constitutional: Negative.   HENT: Negative.    Eyes: Negative.   Respiratory: Negative.    Cardiovascular: Negative.   Gastrointestinal: Negative.   Genitourinary: Negative.   Musculoskeletal:        Left Ankle  congenital Malformation  Skin: Negative.   Neurological: Negative.   Endo/Heme/Allergies: Negative.   Psychiatric/Behavioral:  Positive for depression and suicidal ideas. The patient is nervous/anxious.    Blood pressure (!) 142/103, pulse 62, temperature 98.1 F (36.7 C), temperature source Oral, resp. rate 18, height 5\' 10"  (1.778 m), weight 75.8 kg, SpO2 100%. Body mass index is 23.98 kg/m.  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  Patient will benefit from inpatient Psychiatry unit.  We sill resume his home medications.  Problem 1: Alcohol use disorder, Severe Dependence  Problem 2: Suicide ideation. Disposition:  Admit, seek placement.  Earney Navy, NP-PMHNP-BC 07/16/2023 4:16 PM

## 2023-07-16 NOTE — Progress Notes (Signed)
This CSW requested [facility] Fransico Michael, RN to review pt for inpatient behavioral health placement. Pt meets inpatient BH placement per Earney Navy, NP-PMHNP-BC.   Maryjean Ka, MSW, [facility] 07/16/2023 10:44 PM

## 2023-07-16 NOTE — Progress Notes (Signed)
Pt was accepted to [facility] TOMORROW 07/17/23 PENDING Discharges   Pt meets inpatient criteria per Earney Navy, NP-PMHNP-BC    Attending Physician will be Dr. Phineas Inches, MD  Report can be called to:  -Adult unit: [phone]  Pt can arrive after: [facility] to coordinate  Care Team notified: Day [facility] Danika Riley,RN, Night [facility] Herold Harms, Bethany Hendra,LCSW   [place], [place] 07/16/2023 @ 11:32 PM

## 2023-07-17 DIAGNOSIS — F1020 Alcohol dependence, uncomplicated: Secondary | ICD-10-CM

## 2023-07-17 MED ORDER — ONDANSETRON 8 MG PO TBDP
8.0000 mg | ORAL_TABLET | Freq: Three times a day (TID) | ORAL | Status: DC | PRN
  Administered 2023-07-17: 8 mg via ORAL
  Filled 2023-07-17: qty 1

## 2023-07-17 MED ORDER — BUPROPION HCL ER (XL) 150 MG PO TB24
150.0000 mg | ORAL_TABLET | Freq: Every day | ORAL | Status: DC
  Administered 2023-07-17: 150 mg via ORAL
  Filled 2023-07-17: qty 1

## 2023-07-17 MED ORDER — NALTREXONE HCL 50 MG PO TABS
50.0000 mg | ORAL_TABLET | Freq: Every day | ORAL | Status: DC
  Administered 2023-07-17: 50 mg via ORAL
  Filled 2023-07-17 (×2): qty 1

## 2023-07-17 NOTE — Progress Notes (Signed)
[facility] Psych ED Progress Note  07/17/2023 11:22 AM Grant Lowery  MRN:  [id number]   Subjective:  Grant Lowery is a 47 y.o. male patient admitted with hx of Alcoholism, OCD and anxiety. [facility] by [facility] of feeling suicidal.  [facility] before this happened today.  He went in there to detox from Alcohol and Benzodiazepine.  While waiting to be seen, Officer saw patient go in and out of the bathroom on few occasions and caught patient using hand sanitizer licking his hand.   Patient is seen today resting comfortably in the hospital bed.  Patient states he is not actively suicidal.  He says he has been suicidal and is sometimes suicidal, that he knows how he would do it, but that he doesn't want to "do that to my parents."  Patient talks in circles and changes his message, it appears, depending on what he thinks the person in front of him wants him to say.  His speech becomes fast intermittently.  He expresses concern that others are watching him and he appears hypervigilant. He is paranoid.  Patient is currently under IVC.  Recommend inpatient psychiatric hospitalization for stabilization.    Principal Problem: Alcohol use disorder, severe, dependence (HCC) Diagnosis:  Principal Problem:   Alcohol use disorder, severe, dependence (HCC) Active Problems:   Suicide ideation   ED Assessment Time Calculation: Start Time: 0930 Stop Time: 1056 Total Time in Minutes (Assessment Completion): 86   Past Psychiatric History: hx of Alcoholism, OCD and anxiety. Alcohol Rehab treatment utilized several states and facilities. Patient denies inpatient Psychiatry hospitalization but has had several rehab treatment stays.   [place] Scale:  Flowsheet Row ED from 07/16/2023 in [facility] Emergency Department at [facility]  C-SSRS RISK CATEGORY High Risk       Past Medical History:  Past Medical History:  Diagnosis  Date   Alcoholism (HCC)    OCD (obsessive compulsive disorder)    History reviewed. No pertinent surgical history. Family History:  Family History  Problem Relation Age of Onset   Cancer Father    Family Psychiatric  History: Denies Social History:  Social History   Substance and Sexual Activity  Alcohol Use No     Social History   Substance and Sexual Activity  Drug Use No    Social History   Socioeconomic History   Marital status: Divorced    Spouse name: Not on file   Number of children: Not on file   Years of education: Not on file   Highest education level: Not on file  Occupational History   Not on file  Tobacco Use   Smoking status: Some Days    Types: Cigars    Passive exposure: Current   Smokeless tobacco: Never  Vaping Use   Vaping status: Never Used  Substance and Sexual Activity   Alcohol use: No   Drug use: No   Sexual activity: Not Currently  Other Topics Concern   Not on file  Social History Narrative   Not on file   Social Determinants of Health   Financial Resource Strain: Not on file  Food Insecurity: Not on file  Transportation Needs: Not on file  Physical Activity: Not on file  Stress: Not on file  Social Connections: Not on file    Sleep: Fair  Appetite:  Poor  Current Medications: Current Facility-Administered Medications  Medication Dose Route Frequency Provider Last  Rate Last Admin   acetaminophen (TYLENOL) tablet 650 mg  650 mg Oral TID PRN Anders Simmonds T, DO       buPROPion (WELLBUTRIN XL) 24 hr tablet 150 mg  150 mg Oral Daily Nathan Stallworth A, NP       fluvoxaMINE (LUVOX) tablet 50 mg  50 mg Oral TID Anders Simmonds T, DO   50 mg at 07/17/23 1014   hydrOXYzine (ATARAX) tablet 10 mg  10 mg Oral TID PRN Anders Simmonds T, DO   10 mg at 07/16/23 1627   naltrexone (DEPADE) tablet 50 mg  50 mg Oral Daily Avian Konigsberg A, NP       naproxen (NAPROSYN) tablet 500 mg  500 mg Oral BID WC Anders Simmonds T, DO   500 mg at 07/17/23  1015   ondansetron (ZOFRAN-ODT) disintegrating tablet 8 mg  8 mg Oral Q8H PRN Benjiman Core, MD   8 mg at 07/17/23 1026   QUEtiapine (SEROQUEL) tablet 100 mg  100 mg Oral QHS PRN Anders Simmonds T, DO   100 mg at 07/16/23 2222   thiamine (VITAMIN B1) tablet 100 mg  100 mg Oral Daily Anders Simmonds T, DO   100 mg at 07/17/23 1014   Current Outpatient Medications  Medication Sig Dispense Refill   fluvoxaMINE (LUVOX) 50 MG tablet Take 50 mg by mouth 3 (three) times daily.     GNP PAIN RELIEF 325 MG tablet Take 650 mg by mouth 3 (three) times daily as needed.     naproxen (NAPROSYN) 500 MG tablet SMARTSIG:1 Tablet(s) By Mouth Morning-Night     QUEtiapine (SEROQUEL) 100 MG tablet Take 100 mg by mouth at bedtime as needed.     thiamine (VITAMIN B1) 100 MG tablet Take 1 tablet by mouth daily.      Lab Results:  Results for orders placed or performed during the hospital encounter of 07/16/23 (from the past 48 hour(s))  Comprehensive metabolic panel     Status: Abnormal   Collection Time: 07/16/23 12:20 PM  Result Value Ref Range   Sodium 133 (L) 135 - 145 mmol/L   Potassium 3.9 3.5 - 5.1 mmol/L   Chloride 97 (L) 98 - 111 mmol/L   CO2 29 22 - 32 mmol/L   Glucose, Bld 92 70 - 99 mg/dL    Comment: Glucose reference range applies only to samples taken after fasting for at least 8 hours.   BUN 13 6 - 20 mg/dL   Creatinine, Ser 5.46 0.61 - 1.24 mg/dL   Calcium 9.3 8.9 - 50.3 mg/dL   Total Protein 7.3 6.5 - 8.1 g/dL   Albumin 4.9 3.5 - 5.0 g/dL   AST 21 15 - 41 U/L   ALT 19 0 - 44 U/L   Alkaline Phosphatase 49 38 - 126 U/L   Total Bilirubin 1.1 0.3 - 1.2 mg/dL   GFR, Estimated >54 >65 mL/min    Comment: (NOTE) Calculated using the CKD-EPI Creatinine Equation (2021)    Anion gap 7 5 - 15    Comment: Performed at [facility], [address] [place], [place] [address]  Ethanol     Status: None   Collection Time: 07/16/23 12:20 PM  Result Value Ref Range   Alcohol,  Ethyl (B) <10 <10 mg/dL    Comment: (NOTE) Lowest detectable limit for serum alcohol is 10 mg/dL.  For medical purposes only. Performed at [facility], [address], [place], [place] [address]   CBC  with Diff     Status: Abnormal   Collection Time: 07/16/23 12:20 PM  Result Value Ref Range   WBC 3.7 (L) 4.0 - 10.5 K/uL   RBC 5.12 4.22 - 5.81 MIL/uL   Hemoglobin 15.4 13.0 - 17.0 g/dL   HCT 31.5 17.6 - 16.0 %   MCV 89.6 80.0 - 100.0 fL   MCH 30.1 26.0 - 34.0 pg   MCHC 33.6 30.0 - 36.0 g/dL   RDW 73.7 10.6 - 26.9 %   Platelets 186 150 - 400 K/uL   nRBC 0.0 0.0 - 0.2 %   Neutrophils Relative % 57 %   Neutro Abs 2.1 1.7 - 7.7 K/uL   Lymphocytes Relative 31 %   Lymphs Abs 1.1 0.7 - 4.0 K/uL   Monocytes Relative 11 %   Monocytes Absolute 0.4 0.1 - 1.0 K/uL   Eosinophils Relative 0 %   Eosinophils Absolute 0.0 0.0 - 0.5 K/uL   Basophils Relative 1 %   Basophils Absolute 0.0 0.0 - 0.1 K/uL   Immature Granulocytes 0 %   Abs Immature Granulocytes 0.00 0.00 - 0.07 K/uL    Comment: Performed at [facility], [address] [place], [place] [address]  Urine rapid drug screen (hosp performed)     Status: Abnormal   Collection Time: 07/16/23 12:43 PM  Result Value Ref Range   Opiates NONE DETECTED NONE DETECTED   Cocaine NONE DETECTED NONE DETECTED   Benzodiazepines POSITIVE (A) NONE DETECTED   Amphetamines NONE DETECTED NONE DETECTED   Tetrahydrocannabinol NONE DETECTED NONE DETECTED   Barbiturates NONE DETECTED NONE DETECTED    Comment: (NOTE) DRUG SCREEN FOR MEDICAL PURPOSES ONLY.  IF CONFIRMATION IS NEEDED FOR ANY PURPOSE, NOTIFY LAB WITHIN 5 DAYS.  LOWEST DETECTABLE LIMITS FOR URINE DRUG SCREEN Drug Class                     Cutoff (ng/mL) Amphetamine and metabolites    1000 Barbiturate and metabolites    200 Benzodiazepine                 200 Opiates and metabolites        300 Cocaine and metabolites        300 THC                             50 Performed at [facility], [address] [place], [place] [address]     Blood Alcohol level:  Lab Results  Component Value Date   [facility] <10 07/16/2023   ETH <10 11/20/2017    Musculoskeletal: Strength & Muscle Tone: within normal limits Gait & Station: normal Patient leans: N/A  Psychiatric Specialty Exam:  Presentation  General Appearance:  Disheveled  Eye Contact: Good  Speech: Clear and Coherent; Normal Rate  Speech Volume: Normal  Handedness: Right   Mood and Affect  Mood: Anxious  Affect: Congruent   Thought Process  Thought Processes: Coherent; Goal Directed  Descriptions of Associations:Circumstantial  Orientation:Full (Time, Place and Person)  Thought Content:WDL  History of Schizophrenia/Schizoaffective disorder: No  Hallucinations: None  Ideas of Reference:Paranoia  Suicidal Thoughts:Suicidal Thoughts: Yes, Passive SI Passive Intent and/or Plan: Without Intent; With Plan  Homicidal Thoughts:Homicidal Thoughts: No   Sensorium  Memory: Immediate Good; Recent Good; Remote Good  Judgment: Impaired  Insight: Poor   Executive Functions  Concentration: Good  Attention Span: Good  Recall: Good  Fund of Knowledge: Good  Language: Good   Psychomotor Activity  Psychomotor Activity: Psychomotor Activity: Normal   Assets  Assets: Communication Skills; Physical Health; Leisure Time   Sleep  Sleep: Sleep: Fair Number of Hours of Sleep: 5    Physical Exam: Physical Exam Vitals and nursing note reviewed.  Eyes:     Pupils: Pupils are equal, round, and reactive to light.  Pulmonary:     Effort: Pulmonary effort is normal.  Skin:    General: Skin is dry.  Neurological:     Mental Status: He is alert and oriented to person, place, and time.    Review of Systems  Musculoskeletal:        Complained of ankle pain  Psychiatric/Behavioral:  Positive for substance abuse.  The patient is nervous/anxious.        Intermittent passive suicidal ideation   All other systems reviewed and are negative.  Blood pressure 113/67, pulse 64, temperature 97.7 F (36.5 C), temperature source Oral, resp. rate 19, height 5\' 10"  (1.778 m), weight 75.8 kg, SpO2 100%. Body mass index is 23.98 kg/m.   Medical Decision Making: Patient case reviewed and discussed with Dr Lucianne Muss.  Patient is intermittently suicidal, suffering from substance abuse and paranoid.  Recommend inpatient psychiatric hospitalization for stabilization.  Problem 1: Alcohol use disorder, severe dependence -Naltrexone 50mg  PO Q day   Problem 2: Suicidal ideation -Recommend inpatient psychiatric hospitalization -Restart Welbutrin XL 150mg  PO Q day  Thomes Lolling, NP 07/17/2023, 11:22 AM

## 2023-07-17 NOTE — ED Notes (Signed)
Per [facility], report can be called after 1900 07/17/23. Will notify next shift.

## 2023-07-17 NOTE — ED Provider Notes (Signed)
  Physical Exam  BP 113/67 (BP Location: Left Arm)   Pulse 64   Temp 97.7 F (36.5 C) (Oral)   Resp 19   Ht 5\' 10"  (1.778 m)   Wt 75.8 kg   SpO2 100%   BMI 23.98 kg/m   Physical Exam  Procedures  Procedures  ED Course / MDM    Medical Decision Making Amount and/or Complexity of Data Reviewed Labs: ordered. Radiology: ordered.  Risk OTC drugs. Prescription drug management.   Patient reportedly pending behavioral health Hospital placement later today with discharges.       Benjiman Core, MD 07/17/23 [phone]

## 2023-07-17 NOTE — ED Notes (Signed)
Pt is currently sleeping with no signs of distress or discomfort sitter is present. Call to [facility] adult psych pt is accepted and has ready bed , [facility] [employer] called and alerted for need to transport via message on answer machine.

## 2023-07-17 NOTE — Progress Notes (Signed)
  Pt was accepted to [facility] TODAY 07/17/2023. Bed assignment: 309   Pt meets inpatient criteria per Phebe Colla, NP   Attending Physician will be Elane Fritz, DO   Report can be called to: [phone]   Pt can arrive after pending discharges  Care Team Notified: [facility] Rona Ravens, Doyce Para, RN, and Phebe Colla, NP   Cathie Beams, LCSW  07/17/2023 11:30 AM

## 2023-07-18 ENCOUNTER — Inpatient Hospital Stay
Admission: AD | Admit: 2023-07-18 | Discharge: 2023-07-22 | DRG: 885 | Disposition: A | Discharge status: Rehabilitation Facility | Payer: BC Managed Care – PPO | Source: Intra-hospital | Attending: Psychiatry | Admitting: Psychiatry

## 2023-07-18 ENCOUNTER — Other Ambulatory Visit: Payer: Self-pay

## 2023-07-18 ENCOUNTER — Encounter: Payer: Self-pay | Admitting: Nurse Practitioner

## 2023-07-18 DIAGNOSIS — F429 Obsessive-compulsive disorder, unspecified: Secondary | ICD-10-CM | POA: Diagnosis present

## 2023-07-18 DIAGNOSIS — F17210 Nicotine dependence, cigarettes, uncomplicated: Secondary | ICD-10-CM | POA: Diagnosis present

## 2023-07-18 DIAGNOSIS — F332 Major depressive disorder, recurrent severe without psychotic features: Principal | ICD-10-CM | POA: Diagnosis present

## 2023-07-18 DIAGNOSIS — Z79899 Other long term (current) drug therapy: Secondary | ICD-10-CM | POA: Diagnosis not present

## 2023-07-18 DIAGNOSIS — F1024 Alcohol dependence with alcohol-induced mood disorder: Secondary | ICD-10-CM | POA: Diagnosis present

## 2023-07-18 DIAGNOSIS — F1090 Alcohol use, unspecified, uncomplicated: Secondary | ICD-10-CM | POA: Diagnosis not present

## 2023-07-18 DIAGNOSIS — Z560 Unemployment, unspecified: Secondary | ICD-10-CM | POA: Diagnosis not present

## 2023-07-18 DIAGNOSIS — F419 Anxiety disorder, unspecified: Secondary | ICD-10-CM | POA: Diagnosis present

## 2023-07-18 DIAGNOSIS — Z5900 Homelessness unspecified: Secondary | ICD-10-CM

## 2023-07-18 DIAGNOSIS — F1994 Other psychoactive substance use, unspecified with psychoactive substance-induced mood disorder: Secondary | ICD-10-CM | POA: Diagnosis present

## 2023-07-18 DIAGNOSIS — F1020 Alcohol dependence, uncomplicated: Secondary | ICD-10-CM | POA: Diagnosis present

## 2023-07-18 DIAGNOSIS — R45851 Suicidal ideations: Secondary | ICD-10-CM | POA: Diagnosis present

## 2023-07-18 DIAGNOSIS — Z5941 Food insecurity: Secondary | ICD-10-CM | POA: Diagnosis not present

## 2023-07-18 DIAGNOSIS — G4700 Insomnia, unspecified: Secondary | ICD-10-CM | POA: Diagnosis present

## 2023-07-18 DIAGNOSIS — F17290 Nicotine dependence, other tobacco product, uncomplicated: Secondary | ICD-10-CM | POA: Diagnosis present

## 2023-07-18 MED ORDER — MAGNESIUM HYDROXIDE 400 MG/5ML PO SUSP: 30.0000 mL | Freq: Every day | ORAL | Status: DC | PRN

## 2023-07-18 MED ORDER — LORAZEPAM 2 MG/ML IJ SOLN: 2.0000 mg | Freq: Three times a day (TID) | INTRAMUSCULAR | Status: DC | PRN

## 2023-07-18 MED ORDER — THIAMINE MONONITRATE 100 MG PO TABS
100.0000 mg | ORAL_TABLET | Freq: Every day | ORAL | Status: DC
  Administered 2023-07-18 – 2023-07-22 (×5): 100 mg via ORAL
  Filled 2023-07-18 (×5): qty 1

## 2023-07-18 MED ORDER — FLUVOXAMINE MALEATE 50 MG PO TABS
50.0000 mg | ORAL_TABLET | Freq: Three times a day (TID) | ORAL | Status: DC
  Administered 2023-07-19 – 2023-07-22 (×11): 50 mg via ORAL
  Filled 2023-07-18 (×13): qty 1

## 2023-07-18 MED ORDER — HALOPERIDOL 5 MG PO TABS: 5.0000 mg | ORAL_TABLET | Freq: Three times a day (TID) | ORAL | Status: DC | PRN

## 2023-07-18 MED ORDER — BUPROPION HCL ER (XL) 150 MG PO TB24
150.0000 mg | ORAL_TABLET | Freq: Every day | ORAL | Status: DC
  Administered 2023-07-18 – 2023-07-22 (×5): 150 mg via ORAL
  Filled 2023-07-18 (×5): qty 1

## 2023-07-18 MED ORDER — NICOTINE 14 MG/24HR TD PT24
14.0000 mg | MEDICATED_PATCH | Freq: Every day | TRANSDERMAL | Status: DC
  Administered 2023-07-18 – 2023-07-22 (×4): 14 mg via TRANSDERMAL
  Filled 2023-07-18 (×5): qty 1

## 2023-07-18 MED ORDER — LORAZEPAM 2 MG PO TABS
2.0000 mg | ORAL_TABLET | Freq: Three times a day (TID) | ORAL | Status: DC | PRN
  Administered 2023-07-18 (×2): 2 mg via ORAL
  Filled 2023-07-18 (×2): qty 1

## 2023-07-18 MED ORDER — HALOPERIDOL LACTATE 5 MG/ML IJ SOLN: 5.0000 mg | Freq: Three times a day (TID) | INTRAMUSCULAR | Status: DC | PRN

## 2023-07-18 MED ORDER — NALTREXONE HCL 50 MG PO TABS
50.0000 mg | ORAL_TABLET | Freq: Every day | ORAL | Status: DC
  Administered 2023-07-18 – 2023-07-22 (×5): 50 mg via ORAL
  Filled 2023-07-18 (×5): qty 1

## 2023-07-18 MED ORDER — TRAZODONE HCL 50 MG PO TABS: 50.0000 mg | ORAL_TABLET | Freq: Every evening | ORAL | Status: DC | PRN

## 2023-07-18 MED ORDER — ACETAMINOPHEN 325 MG PO TABS
650.0000 mg | ORAL_TABLET | Freq: Four times a day (QID) | ORAL | Status: DC | PRN
  Administered 2023-07-22: 650 mg via ORAL
  Filled 2023-07-18: qty 2

## 2023-07-18 MED ORDER — DIPHENHYDRAMINE HCL 25 MG PO CAPS
50.0000 mg | ORAL_CAPSULE | Freq: Three times a day (TID) | ORAL | Status: DC | PRN
  Administered 2023-07-19: 50 mg via ORAL
  Filled 2023-07-18: qty 2

## 2023-07-18 MED ORDER — ONDANSETRON HCL 4 MG PO TABS
8.0000 mg | ORAL_TABLET | Freq: Once | ORAL | Status: AC
  Administered 2023-07-18: 8 mg via ORAL
  Filled 2023-07-18: qty 2

## 2023-07-18 MED ORDER — HYDROXYZINE HCL 50 MG PO TABS
50.0000 mg | ORAL_TABLET | Freq: Three times a day (TID) | ORAL | Status: DC | PRN
  Administered 2023-07-19 – 2023-07-21 (×6): 50 mg via ORAL
  Filled 2023-07-18 (×6): qty 1

## 2023-07-18 MED ORDER — DIPHENHYDRAMINE HCL 50 MG/ML IJ SOLN: 50.0000 mg | Freq: Three times a day (TID) | INTRAMUSCULAR | Status: DC | PRN

## 2023-07-18 MED ORDER — HYDROXYZINE HCL 25 MG PO TABS
25.0000 mg | ORAL_TABLET | Freq: Three times a day (TID) | ORAL | Status: DC | PRN
  Administered 2023-07-18: 25 mg via ORAL
  Filled 2023-07-18: qty 1

## 2023-07-18 MED ORDER — TRAZODONE HCL 50 MG PO TABS
50.0000 mg | ORAL_TABLET | Freq: Every day | ORAL | Status: DC
  Administered 2023-07-18 – 2023-07-21 (×4): 50 mg via ORAL
  Filled 2023-07-18 (×4): qty 1

## 2023-07-18 MED ORDER — ALUM & MAG HYDROXIDE-SIMETH 200-200-20 MG/5ML PO SUSP: 30.0000 mL | ORAL | Status: DC | PRN

## 2023-07-18 NOTE — Group Note (Signed)
Date:  07/18/2023 Time:  11:19 PM  Group Topic/Focus:  Developing a Wellness Toolbox:   The focus of this group is to help patients develop a "wellness toolbox" with skills and strategies to promote recovery upon discharge.    Participation Level:  Active  Participation Quality:  Appropriate  Affect:  Appropriate  Cognitive:  Appropriate  Insight: Appropriate  Engagement in Group:  Engaged  Modes of Intervention:  Discussion  Additional Comments:   Lenore Cordia 07/18/2023, 11:19 PM

## 2023-07-18 NOTE — Group Note (Signed)
Date:  07/18/2023 Time:  4:17 PM  Group Topic/Focus:  Activity Group:  The focus of this group is to promote activity and wellness for patients to get some fresh air and also get some exercise.    Participation Level:  Active   Grant Lowery Grant Lowery 07/18/2023, 4:17 PM

## 2023-07-18 NOTE — Progress Notes (Signed)
47 year old male patient admitted with substance induced mood disorder. Patient pleasant and cooperative on approach. Patient states " [facility] I told the PA that I am suicidal with a plan to overdose. They took me to the ED."  Patient denies SI,HI and AVH at this time. Patient states that he had his bachelors from [place] and  from [place]. With his  long history of alcoholism he lost his job and everything. Patient states that he is homeless now.Skin assessment and body search done with [facility]. 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 Patient have tattoos both arms ,chest, back and left leg. Oriented to unit and made comfortable in room. Breakfast provided. Support and encouragement given.

## 2023-07-18 NOTE — Group Note (Signed)
Recreation Therapy Group Note   Group Topic:Healthy Support Systems  Group Date: 07/18/2023 Start Time: 1005 End Time: 1100 Facilitators: Rosina Lowenstein, LRT, CTRS Location:  Craft Room  Group Description: Straw Bridge.  Patients were given 10 plastic drinking straws and an equal length of masking tape. Using the materials provided, patients were instructed to build a free-standing bridge-like structure to suspend an everyday item (ex: deck of cards) off the floor or table surface. All materials were required to be used in Secondary school teacher. LRT facilitated post-activity discussion reviewing the importance of having strong and healthy support systems in our lives. LRT discussed how the people in our lives serve as the tape and the deck of cards we placed on top of our straw structure are the stressors we face in daily life. LRT and pts discussed what happens in our life when things get too heavy for [place], and we don't have strong supports outside of the hospital. Pt shared 2 of their healthy supports aloud in the group.    Goal Area(s) Addressed:  Patient will identify 2 healthy supports in their life. Patient will identify skills to successfully complete activity. Patient will identify correlation of this activity to life post-discharge.  Patient will work on Product manager.   Affect/Mood: Appropriate   Participation Level: Active and Engaged   Participation Quality: Independent   Behavior: Appropriate, Calm, and Cooperative   Speech/Thought Process: Coherent   Insight: Good   Judgement: Good   Modes of Intervention: Activity   Patient Response to Interventions:  Attentive, Engaged, Interested , and Receptive   Education Outcome:  Acknowledges education   Clinical Observations/Individualized Feedback: Coady was active in their participation of session activities and group discussion. Pt identified "NA and AA meetings" as his healthy supports. Pt completed activity  with all given materials. Pt interacted well with LRT and peers duration of session.   Plan: Continue to engage patient in RT group sessions 2-3x/week.   Rosina Lowenstein, LRT, CTRS 07/18/2023 11:49 AM

## 2023-07-18 NOTE — ED Notes (Signed)
Grant Lowery remains asleep with no signs of distress.

## 2023-07-18 NOTE — Group Note (Signed)
Date:  07/18/2023 Time:  11:50 AM  Group Topic/Focus:  Crisis Planning:   The purpose of this group is to help patients create a crisis plan for use upon discharge or in the future, as needed.    Participation Level:  Did Not Attend   Rosaura Carpenter 07/18/2023, 11:50 AM

## 2023-07-18 NOTE — Tx Team (Signed)
Initial Treatment Plan 07/18/2023 11:18 AM [name] [id number]    PATIENT STRESSORS: Financial difficulties   Substance abuse     PATIENT STRENGTHS: Average or above average intelligence  Communication skills  Physical Health  Work skills    PATIENT IDENTIFIED PROBLEMS: Substance abuse  Homelessness   Depression                  DISCHARGE CRITERIA:  Medical problems require only outpatient monitoring Motivation to continue treatment in a less acute level of care Verbal commitment to aftercare and medication compliance  PRELIMINARY DISCHARGE PLAN: Attend aftercare/continuing care group Placement in alternative living arrangements  PATIENT/FAMILY INVOLVEMENT: This treatment plan has been presented to and reviewed with the patient, Grant Lowery, and/or family member, The patient and family have been given the opportunity to ask questions and make suggestions.  Leonarda Salon, RN 07/18/2023, 11:18 AM

## 2023-07-18 NOTE — ED Provider Notes (Signed)
Emergency Medicine Observation Re-evaluation Note  Grant Lowery is a 47 y.o. male, seen on rounds today.  Pt initially presented to the ED for complaints of IVC, suicidal Currently, the patient is asleep.  Pt presented on 8/13 with si.  He was IVC'd.  Inpatient psych tx recommended.  He has been accepted to [facility].  Physical Exam  BP 127/78 (BP Location: Left Arm)   Pulse (!) 56   Temp (!) 97.5 F (36.4 C) (Oral)   Resp 18   Ht 5\' 10"  (1.778 m)   Wt 75.8 kg   SpO2 97%   BMI 23.98 kg/m  Physical Exam General: asleep Cardiac: rr Lungs: clear Psych: calm  ED Course / MDM  EKG:EKG Interpretation Date/Time:  Tuesday July 16 2023 12:27:46 EDT Ventricular Rate:  54 PR Interval:  194 QRS Duration:  112 QT Interval:  464 QTC Calculation: 440 R Axis:   24  Text Interpretation: Sinus rhythm Borderline intraventricular conduction delay Confirmed by Alvester Chou [phone]) on 07/16/2023 5:21:56 PM  I have reviewed the labs performed to date as well as medications administered while in observation.  Recent changes in the last 24 hours include acceptance at [facility].  Plan  Current plan is for Tx.  Pt is stable for tx.    Jacalyn Lefevre, MD 07/18/23 [phone]

## 2023-07-18 NOTE — BHH Counselor (Signed)
CSW assisted patient in identifying aftercare providers.    CSW provided patient with the contact information for several facilities in [place].    CSW also provided patient with contact information for [address].  Penni Homans, MSW, LCSW 07/18/2023 3:49 PM

## 2023-07-19 DIAGNOSIS — F1090 Alcohol use, unspecified, uncomplicated: Secondary | ICD-10-CM

## 2023-07-19 MED ORDER — GABAPENTIN 100 MG PO CAPS
200.0000 mg | ORAL_CAPSULE | Freq: Two times a day (BID) | ORAL | Status: DC
  Administered 2023-07-19 – 2023-07-20 (×2): 200 mg via ORAL
  Filled 2023-07-19 (×2): qty 2
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
## 2023-07-19 NOTE — Group Note (Signed)
Date:  07/19/2023 Time:  11:30 AM  Group Topic/Focus:  Building Self Esteem:   The Focus of this group is helping patients become aware of the effects of self-esteem on their lives, the things they and others do that enhance or undermine their self-esteem, seeing the relationship between their level of self-esteem and the choices they make and learning ways to enhance self-esteem. Coping With Mental Health Crisis:   The purpose of this group is to help patients identify strategies for coping with mental health crisis.  Group discusses possible causes of crisis and ways to manage them effectively. Goals Group:   The focus of this group is to help patients establish daily goals to achieve during treatment and discuss how the patient can incorporate goal setting into their daily lives to aide in recovery.    Participation Level:  Did Not Attend  Participation Quality:    Affect:    Cognitive:    Insight:   Engagement in Group:    Modes of Intervention:    Additional Comments:    Lenna Hagarty 07/19/2023, 11:30 AM

## 2023-07-19 NOTE — Group Note (Signed)
[facility] LCSW Group Therapy Note   Group Date: 07/18/2023 Start Time: 1315 End Time: 1420   Type of Therapy/Topic:  Group Therapy:  Emotion Regulation  Participation Level:  Did Not Attend    Description of Group:    The purpose of this group is to assist patients in learning to regulate negative emotions and experience positive emotions. Patients will be guided to discuss ways in which they have been vulnerable to their negative emotions. These vulnerabilities will be juxtaposed with experiences of positive emotions or situations, and patients challenged to use positive emotions to combat negative ones. Special emphasis will be placed on coping with negative emotions in conflict situations, and patients will process healthy conflict resolution skills.  Therapeutic Goals: Patient will identify two positive emotions or experiences to reflect on in order to balance out negative emotions:  Patient will label two or more emotions that they find the most difficult to experience:  Patient will be able to demonstrate positive conflict resolution skills through discussion or role plays:   Summary of Patient Progress: Patient unable to attend group process as he was completing assessment with CSW.   Therapeutic Modalities:   Cognitive Behavioral Therapy Feelings Identification Dialectical Behavioral Therapy   Glenis Smoker, LCSW

## 2023-07-19 NOTE — Group Note (Signed)
Recreation Therapy Group Note   Group Topic:Leisure Education  Group Date: 07/19/2023 Start Time: 1000 End Time: 1100 Facilitators: Rosina Lowenstein, LRT, CTRS Location:  Craft Room  Group Description: Leisure. Patients were given the option to choose from coloring, singing karaoke, journaling, or playing cards. LRT and pts discussed the meaning of leisure, the importance of participating in leisure during their free time/when they're outside of the hospital, as well as how our leisure interests can also serve as coping skills. Pt identified two leisure interests and shared with the group.   Goal Area(s) Addressed:  Patient will identify a current leisure interest.  Patient will learn the definition of "leisure". Patient will practice making a positive decision. Patient will have the opportunity to try a new leisure activity. Patient will communicate with peers and LRT.    Affect/Mood: Appropriate   Participation Level: Active and Engaged   Participation Quality: Independent   Behavior: Calm and Cooperative   Speech/Thought Process: Coherent   Insight: Good   Judgement: Good   Modes of Intervention: Activity   Patient Response to Interventions:  Attentive, Engaged, Interested , and Receptive   Education Outcome:  Acknowledges education   Clinical Observations/Individualized Feedback: Grant Lowery was active in their participation of session activities and group discussion. Pt identified "walk and play basketball" as things he does in his free time. Pt chose to journal and sing karaoke while in group. Pt interacted well with LRT and peers duration of session.   Plan: Continue to engage patient in RT group sessions 2-3x/week.   Rosina Lowenstein, LRT, CTRS 07/19/2023 12:02 PM

## 2023-07-19 NOTE — BH IP Treatment Plan (Signed)
Interdisciplinary Treatment and Diagnostic Plan Update  07/19/2023 Time of Session: 9:29AM Grant Lowery MRN: [id number]  Principal Diagnosis: Major depressive disorder, recurrent episode, severe (HCC)  Secondary Diagnoses: Principal Problem:   Major depressive disorder, recurrent episode, severe (HCC) Active Problems:   Substance induced mood disorder (HCC)   Current Medications:  Current Facility-Administered Medications  Medication Dose Route Frequency Provider Last Rate Last Admin   acetaminophen (TYLENOL) tablet 650 mg  650 mg Oral Q6H PRN Onuoha, Chinwendu V, NP       alum & mag hydroxide-simeth (MAALOX/MYLANTA) 200-200-20 MG/5ML suspension 30 mL  30 mL Oral Q4H PRN Onuoha, Chinwendu V, NP       buPROPion (WELLBUTRIN XL) 24 hr tablet 150 mg  150 mg Oral Daily Weber, Kyra A, NP   150 mg at 07/19/23 7564   diphenhydrAMINE (BENADRYL) capsule 50 mg  50 mg Oral TID PRN Onuoha, Chinwendu V, NP       Or   diphenhydrAMINE (BENADRYL) injection 50 mg  50 mg Intramuscular TID PRN Onuoha, Chinwendu V, NP       fluvoxaMINE (LUVOX) tablet 50 mg  50 mg Oral TID Weber, Kyra A, NP   50 mg at 07/19/23 3329   haloperidol (HALDOL) tablet 5 mg  5 mg Oral TID PRN Onuoha, Chinwendu V, NP       Or   haloperidol lactate (HALDOL) injection 5 mg  5 mg Intramuscular TID PRN Onuoha, Chinwendu V, NP       hydrOXYzine (ATARAX) tablet 50 mg  50 mg Oral TID PRN Bennett, Christal H, NP   50 mg at 07/19/23 0826   LORazepam (ATIVAN) tablet 2 mg  2 mg Oral TID PRN Onuoha, Chinwendu V, NP   2 mg at 07/18/23 2001   Or   LORazepam (ATIVAN) injection 2 mg  2 mg Intramuscular TID PRN Onuoha, Chinwendu V, NP       magnesium hydroxide (MILK OF MAGNESIA) suspension 30 mL  30 mL Oral Daily PRN Onuoha, Chinwendu V, NP       naltrexone (DEPADE) tablet 50 mg  50 mg Oral Daily Weber, Kyra A, NP   50 mg at 07/19/23 5188   nicotine (NICODERM CQ - dosed in mg/24 hours) patch 14 mg  14 mg Transdermal Daily Lewanda Rife, MD    14 mg at 07/18/23 1155   thiamine (VITAMIN B1) tablet 100 mg  100 mg Oral Daily Weber, Kyra A, NP   100 mg at 07/19/23 0824   traZODone (DESYREL) tablet 50 mg  50 mg Oral QHS Bennett, Christal H, NP   50 mg at 07/18/23 2128   PTA Medications: Medications Prior to Admission  Medication Sig Dispense Refill Last Dose   fluvoxaMINE (LUVOX) 50 MG tablet Take 50 mg by mouth 3 (three) times daily.      GNP PAIN RELIEF 325 MG tablet Take 650 mg by mouth 3 (three) times daily as needed.      naproxen (NAPROSYN) 500 MG tablet SMARTSIG:1 Tablet(s) By Mouth Morning-Night      QUEtiapine (SEROQUEL) 100 MG tablet Take 100 mg by mouth at bedtime as needed.      thiamine (VITAMIN B1) 100 MG tablet Take 1 tablet by mouth daily.       Patient Stressors: Financial difficulties   Substance abuse    Patient Strengths: Average or above average intelligence  Communication skills  Physical Health  Work skills   Treatment Modalities: Medication Management, Group therapy, Case management,  1 to 1  session with clinician, Psychoeducation, Recreational therapy.   Physician Treatment Plan for Primary Diagnosis: Major depressive disorder, recurrent episode, severe (HCC) Long Term Goal(s):     Short Term Goals:    Medication Management: Evaluate patient's response, side effects, and tolerance of medication regimen.  Therapeutic Interventions: 1 to 1 sessions, Unit Group sessions and Medication administration.  Evaluation of Outcomes: Not Met  Physician Treatment Plan for Secondary Diagnosis: Principal Problem:   Major depressive disorder, recurrent episode, severe (HCC) Active Problems:   Substance induced mood disorder (HCC)  Long Term Goal(s):     Short Term Goals:       Medication Management: Evaluate patient's response, side effects, and tolerance of medication regimen.  Therapeutic Interventions: 1 to 1 sessions, Unit Group sessions and Medication administration.  Evaluation of Outcomes: Not  Met   RN Treatment Plan for Primary Diagnosis: Major depressive disorder, recurrent episode, severe (HCC) Long Term Goal(s): Knowledge of disease and therapeutic regimen to maintain health will improve  Short Term Goals: Ability to demonstrate self-control, Ability to participate in decision making will improve, Ability to verbalize feelings will improve, Ability to disclose and discuss suicidal ideas, Ability to identify and develop effective coping behaviors will improve, and Compliance with prescribed medications will improve  Medication Management: RN will administer medications as ordered by provider, will assess and evaluate patient's response and provide education to patient for prescribed medication. RN will report any adverse and/or side effects to prescribing provider.  Therapeutic Interventions: 1 on 1 counseling sessions, Psychoeducation, Medication administration, Evaluate responses to treatment, Monitor vital signs and CBGs as ordered, Perform/monitor CIWA, COWS, AIMS and Fall Risk screenings as ordered, Perform wound care treatments as ordered.  Evaluation of Outcomes: Not Met   LCSW Treatment Plan for Primary Diagnosis: Major depressive disorder, recurrent episode, severe (HCC) Long Term Goal(s): Safe transition to appropriate next level of care at discharge, Engage patient in therapeutic group addressing interpersonal concerns.  Short Term Goals: Engage patient in aftercare planning with referrals and resources, Increase social support, Increase ability to appropriately verbalize feelings, Increase emotional regulation, Facilitate acceptance of mental health diagnosis and concerns, Facilitate patient progression through stages of change regarding substance use diagnoses and concerns, Identify triggers associated with mental health/substance abuse issues, and Increase skills for wellness and recovery  Therapeutic Interventions: Assess for all discharge needs, 1 to 1 time with  Social worker, Explore available resources and support systems, Assess for adequacy in community support network, Educate family and significant other(s) on suicide prevention, Complete Psychosocial Assessment, Interpersonal group therapy.  Evaluation of Outcomes: Not Met   Progress in Treatment: Attending groups: Yes. Participating in groups: Yes. Taking medication as prescribed: Yes. Toleration medication: Yes. Family/Significant other contact made: No, will contact:  once permission is given. Patient understands diagnosis: Yes. Discussing patient identified problems/goals with staff: Yes. Medical problems stabilized or resolved: Yes. Denies suicidal/homicidal ideation: Yes. Issues/concerns per patient self-inventory: No. Other: none  New problem(s) identified: No, Describe:  none  New Short Term/Long Term Goal(s): detox, elimination of symptoms of psychosis, medication management for mood stabilization; elimination of SI thoughts; development of comprehensive mental wellness/sobriety plan.   Patient Goals:  "residential treatment for my alcohol use"  Discharge Plan or Barriers: CSW has provided patient with a list of outpatient and inpatient faciilities to address his alcohol use. Patient reports possible plans to go to [place] and CSW has provided the patient with the contact information for those locations.   Reason for Continuation of Hospitalization: Anxiety Depression  Medical Issues Medication stabilization Suicidal ideation Withdrawal symptoms  Estimated Length of Stay:  1-7 days  Last 3 [place] Suicide Severity Risk Score: Flowsheet Row Admission (Current) from 07/18/2023 in [facility] INPATIENT BEHAVIORAL MEDICINE ED from 07/16/2023 in [facility] Emergency Department at [facility]  C-SSRS RISK CATEGORY No Risk High Risk       Last [facility] 2/9 Scores:     No data to display          Scribe for Treatment Team: Harden Mo,  LCSW 07/19/2023 11:46 AM

## 2023-07-19 NOTE — [facility] Suicide Risk Assessment (Signed)
[facility] INPATIENT:  Family/Significant Other Suicide Prevention Education  Suicide Prevention Education:  Education Completed; Kamaurie Schneller, mom, [phone] has been identified by the patient as the family member/significant other with whom the patient will be residing, and identified as the person(s) who will aid the patient in the event of a mental health crisis (suicidal ideations/suicide attempt).  With written consent from the patient, the family member/significant other has been provided the following suicide prevention education, prior to the and/or following the discharge of the patient.  The suicide prevention education provided includes the following: Suicide risk factors Suicide prevention and interventions National Suicide Hotline telephone number [facility] assessment telephone number [facility] Emergency Assistance 911 [facility] and/or Residential Mobile Crisis Unit telephone number  Request made of family/significant other to: Remove weapons (e.g., guns, rifles, knives), all items previously/currently identified as safety concern.   Remove drugs/medications (over-the-counter, prescriptions, illicit drugs), all items previously/currently identified as a safety concern.  The family member/significant other verbalizes understanding of the suicide prevention education information provided.  The family member/significant other agrees to remove the items of safety concern listed above.  Mother reports that "severe depression" is contributing factor to admission.  Mother reports that patient was "very successful".  She reports that "he married the wrong person".  Mother reports that he started drinking during the marriage and his wife "spent everything".  She reports that patient would "try to control things by not eating and he was diagnosed with Anorexia".  Mother reports that he would "drink instead of eating".  Mother reports that "when he sobers up enough to  realize all the things that he lost that it is overwhelming".  Mother reports that "he is very sweet natured person and that has not served him well".  Mother reports that she does not think that patient is a danger to self or others.    Mother reports that patient can NOT go to her home.    He reports that the patient can not live in [place] with any family members.  She does acknowledge that their are more options for treatment in [place].   Mother reports that patient can NOT return to [facility].    Mother reports that transportation to and from places have to be very monitored as he has "managed to get drunk and arrive very intoxicated and lately he has started drinking other things like Listerine trying to get to the alcohol".  Harden Mo 07/19/2023, 4:08 PM

## 2023-07-19 NOTE — Progress Notes (Signed)
   07/19/23 1808  CIWA-Ar  BP 114/71  Pulse Rate 64  Nausea and Vomiting 0  Tactile Disturbances 0  Tremor 1  Auditory Disturbances 0  Paroxysmal Sweats 1  Visual Disturbances 0  Anxiety 2  Headache, Fullness in Head 0  Agitation 0  Orientation and Clouding of Sensorium 0  CIWA-Ar Total 4

## 2023-07-19 NOTE — Progress Notes (Signed)
Pt requested and received prn attarax 50  mg at 0826 Pt stated he had poor results from medication pt is often at nurses station asking for ativan,
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
## 2023-07-19 NOTE — Plan of Care (Signed)
  Problem: Education: Goal: Knowledge of [facility] Education information/materials will improve Outcome: Progressing Goal: Emotional status will improve Outcome: Progressing Goal: Mental status will improve Outcome: Progressing   Problem: Coping: Goal: Coping ability will improve Outcome: Progressing

## 2023-07-19 NOTE — BHH Counselor (Signed)
CSW provided patient with the list of inpatient providers as requested during assessment.   Penni Homans, MSW, LCSW 07/19/2023 11:45 AM

## 2023-07-19 NOTE — Progress Notes (Signed)
Patient presents pleasant and cooperative. Complains of anxiety and insomnia. Prns given with good relief. Reports he does not understand why he was in here. Reports he made statement of having SI, but no plan or no intent. Reports he was in rehab for alcohol. Noted in dayroom watching tv with peers. No other complaints or concerns voiced. 15 min checks remain. Patient remains safe on unit.

## 2023-07-19 NOTE — Progress Notes (Signed)
Pt has called a treatment center in the [place] area and  states  he has been accepted there.

## 2023-07-19 NOTE — Progress Notes (Signed)
[facility] MD Progress Note  07/19/2023  Grant Lowery  MRN:  [id number]  Subjective: Case discussed with staff, chart reviewed, patient seen today during rounds.  Patient reports he feels anxious.  Patient was encouraged to use hydroxyzine as needed for anxiety.  Patient was focused on Ativan.  We discussed addictive nature of benzodiazepine.  Patient today denies thoughts of harming himself or others.  He reports he is interested in going to a rehab facility.  Patient was encouraged to discuss this with Child psychotherapist.  Patient denies manic or psychotic symptoms.  He was encouraged to attend group and work on coping strategies.  Principal Problem: Major depressive disorder, recurrent episode, severe (HCC) Diagnosis: Principal Problem:   Major depressive disorder, recurrent episode, severe (HCC) Active Problems:   Substance induced mood disorder (HCC)   Past Psychiatric History:  Past Psychiatric History: Patient reports an extensive history of alcoholism, OCD and anxiety.  He reports he has received treatment at multiple alcohol rehab treatment centers across several states   Past Medical History:  Past Medical History:  Diagnosis Date   Alcoholism (HCC)    OCD (obsessive compulsive disorder)    History reviewed. No pertinent surgical history. Family History:  Family History  Problem Relation Age of Onset   Cancer Father     Social History:  Social History   Substance and Sexual Activity  Alcohol Use No     Social History   Substance and Sexual Activity  Drug Use No    Social History   Socioeconomic History   Marital status: Divorced    Spouse name: Not on file   Number of children: Not on file   Years of education: Not on file   Highest education level: Not on file  Occupational History   Not on file  Tobacco Use   Smoking status: Some Days    Types: Cigars    Passive exposure: Current   Smokeless tobacco: Never  Vaping Use   Vaping status: Never Used  Substance and  Sexual Activity   Alcohol use: No   Drug use: No   Sexual activity: Not Currently  Other Topics Concern   Not on file  Social History Narrative   Not on file   Social Determinants of Health   Financial Resource Strain: Not on file  Food Insecurity: Food Insecurity Present (07/18/2023)   Hunger Vital Sign    Worried About Running Out of Food in the Last Year: Sometimes true    Ran Out of Food in the Last Year: Sometimes true  Transportation Needs: No Transportation Needs (07/18/2023)   PRAPARE - Administrator, Civil Service (Medical): No    Lack of Transportation (Non-Medical): No  Physical Activity: Not on file  Stress: Not on file  Social Connections: Not on file   Additional Social History:                         Sleep: Fair  Appetite:  Fair  Current Medications: Current Facility-Administered Medications  Medication Dose Route Frequency Provider Last Rate Last Admin   acetaminophen (TYLENOL) tablet 650 mg  650 mg Oral Q6H PRN Onuoha, Chinwendu V, NP       alum & mag hydroxide-simeth (MAALOX/MYLANTA) 200-200-20 MG/5ML suspension 30 mL  30 mL Oral Q4H PRN Onuoha, Chinwendu V, NP       buPROPion (WELLBUTRIN XL) 24 hr tablet 150 mg  150 mg Oral Daily Weber, Leavy Cella, NP  150 mg at 07/19/23 1610   diphenhydrAMINE (BENADRYL) capsule 50 mg  50 mg Oral TID PRN Onuoha, Chinwendu V, NP       Or   diphenhydrAMINE (BENADRYL) injection 50 mg  50 mg Intramuscular TID PRN Onuoha, Chinwendu V, NP       fluvoxaMINE (LUVOX) tablet 50 mg  50 mg Oral TID Weber, Kyra A, NP   50 mg at 07/19/23 1711   gabapentin (NEURONTIN) capsule 200 mg  200 mg Oral BID Charm Rings, NP   200 mg at 07/19/23 1711   haloperidol (HALDOL) tablet 5 mg  5 mg Oral TID PRN Onuoha, Chinwendu V, NP       Or   haloperidol lactate (HALDOL) injection 5 mg  5 mg Intramuscular TID PRN Onuoha, Chinwendu V, NP       hydrOXYzine (ATARAX) tablet 50 mg  50 mg Oral TID PRN Bennett, Christal H, NP   50 mg  at 07/19/23 1711   magnesium hydroxide (MILK OF MAGNESIA) suspension 30 mL  30 mL Oral Daily PRN Onuoha, Chinwendu V, NP       naltrexone (DEPADE) tablet 50 mg  50 mg Oral Daily Weber, Kyra A, NP   50 mg at 07/19/23 0824   nicotine (NICODERM CQ - dosed in mg/24 hours) patch 14 mg  14 mg Transdermal Daily Lewanda Rife, MD   14 mg at 07/18/23 1155   thiamine (VITAMIN B1) tablet 100 mg  100 mg Oral Daily Weber, Kyra A, NP   100 mg at 07/19/23 0824   traZODone (DESYREL) tablet 50 mg  50 mg Oral QHS Bennett, Christal H, NP   50 mg at 07/18/23 2128    Lab Results: No results found for this or any previous visit (from the past 48 hour(s)).  Blood Alcohol level:  Lab Results  Component Value Date   ETH <10 07/16/2023   ETH <10 11/20/2017    Metabolic Disorder Labs: No results found for: "HGBA1C", "MPG" No results found for: "PROLACTIN" No results found for: "CHOL", "TRIG", "HDL", "CHOLHDL", "VLDL", "LDLCALC"   Musculoskeletal: Strength & Muscle Tone: within normal limits Gait & Station: normal Patient leans: N/A                       Psychiatric Specialty Exam:   Presentation  General Appearance:  Appropriate for Environment   Eye Contact: Good   Speech: Clear and Coherent   Speech Volume: Normal   Handedness: Right     Mood and Affect  Mood: Anxious   Affect: Congruent     Thought Process  Thought Processes: Coherent   Duration of Psychotic Symptoms:N/A Descriptions of Associations:Intact   Orientation:Full (Time, Place and Person)   Thought Content:WDL   Hallucinations:Hallucinations: None   Ideas of Reference:None   Suicidal Thoughts:Suicidal Thoughts: No   Homicidal Thoughts:Homicidal Thoughts: No     Sensorium  Memory: Immediate Good; Recent Good   Judgment: Fair   Insight: Fair     Art therapist  Concentration: Fair   Attention Span: Fair   Recall: Good   Fund of Knowledge: Good   Language: Good      Psychomotor Activity  Psychomotor Activity:Psychomotor Activity: Restlessness     Assets  Assets: Communication Skills; Desire for Improvement; Resilience     Sleep  Sleep:Sleep: Fair       Physical Exam: Physical Exam Vitals and nursing note reviewed.  Constitutional:      General: He is not in  acute distress. HENT:     Head: Normocephalic.     Nose: Nose normal.  Pulmonary:     Effort: Pulmonary effort is normal. No respiratory distress.  Musculoskeletal:        General: Normal range of motion.     Cervical back: Normal range of motion.  Neurological:     Mental Status: He is alert and oriented to person, place, and time.     Review of Systems  Respiratory:  Negative for shortness of breath.   Psychiatric/Behavioral:  Positive for depression and substance abuse. The patient is nervous/anxious .   All other systems reviewed and are negative.  Blood pressure 114/71, pulse 64, temperature (!) 97.5 F (36.4 C), temperature source Oral, resp. rate 14, height 5\' 10"  (1.778 m), weight 74.8 kg, SpO2 99%. Body mass index is 23.68 kg/m.   Treatment Plan Summary: Daily contact with patient to assess and evaluate symptoms and progress in treatment and Medication management  Signed and Held Orders Released:  - Luvox: 50 mg three times a day.   - Wellbutrin XL: 150 mg daily.   - Naltrexone: 50 mg daily.     Medications Prescribed:   - Trazodone: 50 mg at bedtime to aid in sleep.   - Hydroxyzine: 50 mg three times daily to manage anxiety symptoms.  Lewanda Rife, MD

## 2023-07-19 NOTE — BHH Counselor (Signed)
CSW contacted [facility] in [place], [place] at patient's request.   CSW was informed that patient does NOT have a scheduled admission date and would need to be approved for care. Patient is under the impression that he will be discharged on 07/22/2023 to be admitted to the program.    The following documents need to be sent to [phone]: facesheet, med list, labs, H&P and progress notes.   CSW unable to send fax at this time as no H&P or progress notes have been entered on the patient.  CSW to make patient aware.  Penni Homans, MSW, LCSW 07/19/2023 4:03 PM

## 2023-07-19 NOTE — Plan of Care (Signed)
D- Patient alert and oriented. Affect is anxious mood cooperative. Denies SI, HI, AVH, and pain. Pt seeks ativan often . Pt is seeking to return to [place] area for treatment A- Scheduled medications administered to patient, per MD orders. Support and encouragement provided.  Routine safety checks conducted every 15 minutes.  Patient informed to notify staff with problems or concerns. R- No adverse drug reactions noted. Patient contracts for safety at this time. Patient compliant with medications and treatment plan. Patient receptive, calm, and cooperative. Patient interacts well with others on the unit.  Patient remains safe at this time.

## 2023-07-19 NOTE — Progress Notes (Signed)
   07/19/23 1800  Psych Admission Type (Psych Patients Only)  Admission Status Voluntary  Psychosocial Assessment  Patient Complaints Anxiety  Eye Contact Fair  Facial Expression Anxious  Affect Anxious  Speech Logical/coherent  Interaction Assertive  Motor Activity Slow  Appearance/Hygiene In scrubs  Behavior Characteristics Cooperative  Mood Anxious  Thought Process  Coherency WDL  Content WDL  Delusions None reported or observed  Perception WDL  Hallucination None reported or observed  Judgment Limited  Confusion None  Danger to Self  Current suicidal ideation? Denies  Agreement Not to Harm Self Yes  Description of Agreement yes  Danger to Others  Danger to Others None reported or observed

## 2023-07-20 DIAGNOSIS — F332 Major depressive disorder, recurrent severe without psychotic features: Secondary | ICD-10-CM | POA: Diagnosis not present

## 2023-07-20 MED ORDER — GABAPENTIN 300 MG PO CAPS
300.0000 mg | ORAL_CAPSULE | Freq: Three times a day (TID) | ORAL | Status: DC
  Administered 2023-07-20 – 2023-07-22 (×7): 300 mg via ORAL
  Filled 2023-07-20 (×7): qty 1

## 2023-07-20 NOTE — Plan of Care (Signed)
  Problem: Education: Goal: Mental status will improve Outcome: Progressing   Problem: Coping: Goal: Will verbalize feelings Outcome: Progressing  Patient is receptive to staff, thoughts are organized, he verbalized hi feelings to staff.

## 2023-07-20 NOTE — BHH Counselor (Signed)
The LCSWA spoke with the patient at his request. The patient just wanted a update on his records being Faxed. The patient stated that he wanted to be discharged early Monday morning because he had to catch a flight.  Patric Dykes, MSW, LCSWA 07/20/2023, 1:00 pm

## 2023-07-20 NOTE — Progress Notes (Signed)
[facility] MD Progress Note  07/20/2023 11:32 AM Grant Lowery  MRN:  [id number]  Subjective:   Notes, labs, and vital signs reviewed prior to assessment.  He denies depression, no suicidal ideations.  Sleep is fair, appetite is "good".  High anxiety with request to increase his gabapentin as he stated he took 400 mg TID, explained it had to be increased over a few days to get to that level.  He is participating in groups and interacting with his peers and staff without issues.  Principal Problem: Major depressive disorder, recurrent severe without psychotic features (HCC) Diagnosis: Principal Problem:   Major depressive disorder, recurrent severe without psychotic features (HCC) Active Problems:   Alcohol use disorder, severe, dependence (HCC)  Total Time spent with patient: 30 minutes  Past Psychiatric History: depression, anxiety, alcohol use d/o  Past Medical History:  Past Medical History:  Diagnosis Date   Alcoholism (HCC)    OCD (obsessive compulsive disorder)    History reviewed. No pertinent surgical history. Family History:  Family History  Problem Relation Age of Onset   Cancer Father    Family Psychiatric  History: none Social History:  Social History   Substance and Sexual Activity  Alcohol Use No     Social History   Substance and Sexual Activity  Drug Use No    Social History   Socioeconomic History   Marital status: Divorced    Spouse name: Not on file   Number of children: Not on file   Years of education: Not on file   Highest education level: Not on file  Occupational History   Not on file  Tobacco Use   Smoking status: Some Days    Types: Cigars    Passive exposure: Current   Smokeless tobacco: Never  Vaping Use   Vaping status: Never Used  Substance and Sexual Activity   Alcohol use: No   Drug use: No   Sexual activity: Not Currently  Other Topics Concern   Not on file  Social History Narrative   Not on file   Social Determinants of  Health   Financial Resource Strain: Not on file  Food Insecurity: Food Insecurity Present (07/18/2023)   Hunger Vital Sign    Worried About Running Out of Food in the Last Year: Sometimes true    Ran Out of Food in the Last Year: Sometimes true  Transportation Needs: No Transportation Needs (07/18/2023)   PRAPARE - Administrator, Civil Service (Medical): No    Lack of Transportation (Non-Medical): No  Physical Activity: Not on file  Stress: Not on file  Social Connections: Not on file   Additional Social History: wants to go to services in [place] after discharge   Sleep: Fair  Appetite:  Good  Current Medications: Current Facility-Administered Medications  Medication Dose Route Frequency Provider Last Rate Last Admin   acetaminophen (TYLENOL) tablet 650 mg  650 mg Oral Q6H PRN Onuoha, Chinwendu V, NP       alum & mag hydroxide-simeth (MAALOX/MYLANTA) 200-200-20 MG/5ML suspension 30 mL  30 mL Oral Q4H PRN Onuoha, Chinwendu V, NP       buPROPion (WELLBUTRIN XL) 24 hr tablet 150 mg  150 mg Oral Daily Weber, Kyra A, NP   150 mg at 07/20/23 0855   diphenhydrAMINE (BENADRYL) capsule 50 mg  50 mg Oral TID PRN Onuoha, Chinwendu V, NP   50 mg at 07/19/23 2108   Or   diphenhydrAMINE (BENADRYL) injection 50 mg  50  mg Intramuscular TID PRN Onuoha, Chinwendu V, NP       fluvoxaMINE (LUVOX) tablet 50 mg  50 mg Oral TID Weber, Kyra A, NP   50 mg at 07/20/23 0855   gabapentin (NEURONTIN) capsule 200 mg  200 mg Oral BID Charm Rings, NP   200 mg at 07/20/23 4098   haloperidol (HALDOL) tablet 5 mg  5 mg Oral TID PRN Onuoha, Chinwendu V, NP       Or   haloperidol lactate (HALDOL) injection 5 mg  5 mg Intramuscular TID PRN Onuoha, Chinwendu V, NP       hydrOXYzine (ATARAX) tablet 50 mg  50 mg Oral TID PRN Bennett, Christal H, NP   50 mg at 07/19/23 1711   magnesium hydroxide (MILK OF MAGNESIA) suspension 30 mL  30 mL Oral Daily PRN Onuoha, Chinwendu V, NP       naltrexone (DEPADE)  tablet 50 mg  50 mg Oral Daily Weber, Kyra A, NP   50 mg at 07/20/23 0855   nicotine (NICODERM CQ - dosed in mg/24 hours) patch 14 mg  14 mg Transdermal Daily Lewanda Rife, MD   14 mg at 07/18/23 1155   thiamine (VITAMIN B1) tablet 100 mg  100 mg Oral Daily Weber, Kyra A, NP   100 mg at 07/20/23 0855   traZODone (DESYREL) tablet 50 mg  50 mg Oral QHS Bennett, Christal H, NP   50 mg at 07/19/23 2108    Lab Results: No results found for this or any previous visit (from the past 48 hour(s)).  Blood Alcohol level:  Lab Results  Component Value Date   ETH <10 07/16/2023   ETH <10 11/20/2017    Metabolic Disorder Labs: No results found for: "HGBA1C", "MPG" No results found for: "PROLACTIN" No results found for: "CHOL", "TRIG", "HDL", "CHOLHDL", "VLDL", "LDLCALC"  Physical Findings: AIMS:  , ,  ,  ,    CIWA:  CIWA-Ar Total: 4 COWS:     Musculoskeletal: Strength & Muscle Tone: within normal limits Gait & Station: normal Patient leans: N/A  Psychiatric Specialty Exam: Physical Exam Vitals and nursing note reviewed.  Constitutional:      Appearance: Normal appearance.  HENT:     Head: Normocephalic.     Nose: Nose normal.  Pulmonary:     Effort: Pulmonary effort is normal.  Musculoskeletal:        General: Normal range of motion.     Cervical back: Normal range of motion.  Neurological:     General: No focal deficit present.     Mental Status: He is alert and oriented to person, place, and time.  Psychiatric:        Attention and Perception: Attention and perception normal.        Mood and Affect: Mood is anxious.        Speech: Speech normal.        Behavior: Behavior normal. Behavior is cooperative.        Thought Content: Thought content normal.        Cognition and Memory: Cognition and memory normal.        Judgment: Judgment normal.     Review of Systems  Psychiatric/Behavioral:  Positive for substance abuse. The patient is nervous/anxious.   All other  systems reviewed and are negative.   Blood pressure 114/71, pulse 64, temperature (!) 97.5 F (36.4 C), temperature source Oral, resp. rate 14, height 5\' 10"  (1.778 m), weight 74.8 kg, SpO2 99%.Body  mass index is 23.68 kg/m.  General Appearance: Casual  Eye Contact:  Good  Speech:  Normal Rate  Volume:  Normal  Mood:  Anxious  Affect:  Congruent  Thought Process:  Coherent  Orientation:  Full (Time, Place, and Person)  Thought Content:  WDL and Logical  Suicidal Thoughts:  No  Homicidal Thoughts:  No  Memory:  Immediate;   Good Recent;   Good Remote;   Good  Judgement:  Fair  Insight:  Fair  Psychomotor Activity:  Normal  Concentration:  Concentration: Good and Attention Span: Good  Recall:  Good  Fund of Knowledge:  Good  Language:  Good  Akathisia:  No  Handed:  Right  AIMS (if indicated):     Assets:  Housing Leisure Time Physical Health Resilience Social Support  ADL's:  Intact  Cognition:  WNL  Sleep:         Physical Exam: Physical Exam Vitals and nursing note reviewed.  Constitutional:      Appearance: Normal appearance.  HENT:     Head: Normocephalic.     Nose: Nose normal.  Pulmonary:     Effort: Pulmonary effort is normal.  Musculoskeletal:        General: Normal range of motion.     Cervical back: Normal range of motion.  Neurological:     General: No focal deficit present.     Mental Status: He is alert and oriented to person, place, and time.  Psychiatric:        Attention and Perception: Attention and perception normal.        Mood and Affect: Mood is anxious.        Speech: Speech normal.        Behavior: Behavior normal. Behavior is cooperative.        Thought Content: Thought content normal.        Cognition and Memory: Cognition and memory normal.        Judgment: Judgment normal.    Review of Systems  Psychiatric/Behavioral:  Positive for substance abuse. The patient is nervous/anxious.   All other systems reviewed and are  negative.  Blood pressure 114/71, pulse 64, temperature (!) 97.5 F (36.4 C), temperature source Oral, resp. rate 14, height 5\' 10"  (1.778 m), weight 74.8 kg, SpO2 99%. Body mass index is 23.68 kg/m.   Treatment Plan Summary: Daily contact with patient to assess and evaluate symptoms and progress in treatment, Medication management, and Plan : Major depressive disorder, recurrent, severe without psychosis: Luvox 50 mg TID Wellbutrin XR 150 mg daily  Anxiety: Gabapentin 200 mg BID increased to 300 mg TID  Alcohol use d/o: Naltrexone 50 mg daily  Insomnia: Trazodone 50 mg daily at bedtime  Nanine Means, NP 07/20/2023, 11:32 AM

## 2023-07-20 NOTE — Plan of Care (Signed)
?  Problem: Education: ?Goal: Knowledge of [facility] Education information/materials will improve ?Outcome: Progressing ?Goal: Emotional status will improve ?Outcome: Progressing ?Goal: Mental status will improve ?Outcome: Progressing ?Goal: Verbalization of understanding the information provided will improve ?Outcome: Progressing ?  ?Problem: Health Behavior/Discharge Planning: ?Goal: Identification of resources available to assist in meeting health care needs will improve ?Outcome: Progressing ?Goal: Compliance with treatment plan for underlying cause of condition will improve ?Outcome: Progressing ?  ?Problem: Education: ?Goal: Utilization of techniques to improve thought processes will improve ?Outcome: Progressing ?Goal: Knowledge of the prescribed therapeutic regimen will improve ?Outcome: Progressing ?  ?

## 2023-07-20 NOTE — Progress Notes (Signed)
Patient alert and oriented x 4, affect is blunted thoughts are organized and coherent, he appeared less anxious,  denies SI/HI/AVH , noted interacting appropriately with peers and staff, no distress noted will continue to monitor closely.

## 2023-07-20 NOTE — Progress Notes (Signed)
D: Patient alert and oriented, able to make needs known. Denies SI/HI, AVH at present. Denies pain at present. Patient goal today "aftercare." Rates depression 4/10, hopelessness 2/10, and anxiety 7/10. Patient reports energy level as normal. He reports he slept well last night. Patient does not request any PRN medication at this time.   A: Scheduled medications administered to patient per MD order. Support and encouragement provided. Routine safety checks conducted every fifteen minutes. Patient informed to notify staff with problems or concerns. Frequent verbal contact made.   R: No adverse drug reactions noted. Patient contracts for safety at this time. Patient is compliant with medications and treatment plan. Patient receptive, calm and cooperative. Patient interacts with others appropriately on unit at present. Patient remains safe at present.

## 2023-07-20 NOTE — Group Note (Signed)
[facility] LCSW Group Therapy Note   Group Date: 07/20/2023 Start Time: 1310 End Time: 1410   Type of Therapy/Topic:  Group Therapy:  Balance in Life  Participation Level:  Active   Description of Group:    This group will address the concept of balance and how it feels and looks when one is unbalanced. Patients will be encouraged to process areas in their lives that are out of balance, and identify reasons for remaining unbalanced. Facilitators will guide patients utilizing problem- solving interventions to address and correct the stressor making their life unbalanced. Understanding and applying boundaries will be explored and addressed for obtaining  and maintaining a balanced life. Patients will be encouraged to explore ways to assertively make their unbalanced needs known to significant others in their lives, using other group members and facilitator for support and feedback.  Therapeutic Goals: Patient will identify two or more emotions or situations they have that consume much of in their lives. Patient will identify signs/triggers that life has become out of balance:  Patient will identify two ways to set boundaries in order to achieve balance in their lives:  Patient will demonstrate ability to communicate their needs through discussion and/or role plays  Summary of Patient Progress:    The patient attended group. The patient stated that he likes one-on-one therapy. The patient stated that he struggles with acceptance. The patient he has a hard time being present. Stating that he uses cleaning and walking as coping skills.    Marshell Levan, LCSW

## 2023-07-21 DIAGNOSIS — F332 Major depressive disorder, recurrent severe without psychotic features: Secondary | ICD-10-CM | POA: Diagnosis not present

## 2023-07-21 MED ORDER — ONDANSETRON HCL 4 MG PO TABS
4.0000 mg | ORAL_TABLET | Freq: Three times a day (TID) | ORAL | Status: DC | PRN
  Administered 2023-07-22: 4 mg via ORAL
  Filled 2023-07-21 (×2): qty 1

## 2023-07-21 NOTE — Plan of Care (Signed)
  Problem: Role Relationship: Goal: Will demonstrate positive changes in social behaviors and relationships Outcome: Progressing   Problem: Safety: Goal: Ability to disclose and discuss suicidal ideas will improve Outcome: Progressing Goal: Ability to identify and utilize support systems that promote safety will improve Outcome: Progressing   

## 2023-07-21 NOTE — Group Note (Signed)
Date:  07/21/2023 Time:  7:13 PM  Group Topic/Focus:  Structured Activity Group    Participation Level:  Active  Participation Quality:  Appropriate and Attentive  Affect:  Appropriate  Cognitive:  Alert and Appropriate  Insight: Appropriate  Engagement in Group:  Engaged  Modes of Intervention:  Activity  Additional Comments:    Maiah Sinning A Aslyn Cottman 07/21/2023, 7:13 PM

## 2023-07-21 NOTE — Progress Notes (Signed)
Patient was making phone calls during meal time and did not eat his dinner tray. Patient is now trying to pick food off of other trays and asking other patients to ask staff to get food for him. Redirected pt that food has to be eaten during meal times and he needs to arrange phone calls during free time. Verbalized understanding.

## 2023-07-21 NOTE — Progress Notes (Signed)
[facility] MD Progress Note  07/21/2023 11:09 AM Grant Lowery  MRN:  [id number]  Subjective:   Notes, labs, and vital signs reviewed prior to assessment.  His depression is a 2/10 with 10 being the highest, no suicidal ideations.  Mild anxiety as his transferring facility has not received his medical records, social work notified.  Sleep is "decent",  appetite is "good".  Denies withdrawal symptoms and cravings, no medication side effects.  The plan is for him to discharge to [facility] to continue his alcohol rehab.  Principal Problem: Major depressive disorder, recurrent severe without psychotic features (HCC) Diagnosis: Principal Problem:   Major depressive disorder, recurrent severe without psychotic features (HCC) Active Problems:   Alcohol use disorder, severe, dependence (HCC)  Total Time spent with patient: 30 minutes  Past Psychiatric History: depression, anxiety, alcohol use d/o  Past Medical History:  Past Medical History:  Diagnosis Date   Alcoholism (HCC)    OCD (obsessive compulsive disorder)    History reviewed. No pertinent surgical history. Family History:  Family History  Problem Relation Age of Onset   Cancer Father    Family Psychiatric  History: none Social History:  Social History   Substance and Sexual Activity  Alcohol Use No     Social History   Substance and Sexual Activity  Drug Use No    Social History   Socioeconomic History   Marital status: Divorced    Spouse name: Not on file   Number of children: Not on file   Years of education: Not on file   Highest education level: Not on file  Occupational History   Not on file  Tobacco Use   Smoking status: Some Days    Types: Cigars    Passive exposure: Current   Smokeless tobacco: Never  Vaping Use   Vaping status: Never Used  Substance and Sexual Activity   Alcohol use: No   Drug use: No   Sexual activity: Not Currently  Other Topics Concern   Not on file  Social  History Narrative   Not on file   Social Determinants of Health   Financial Resource Strain: Not on file  Food Insecurity: Food Insecurity Present (07/18/2023)   Hunger Vital Sign    Worried About Running Out of Food in the Last Year: Sometimes true    Ran Out of Food in the Last Year: Sometimes true  Transportation Needs: No Transportation Needs (07/18/2023)   PRAPARE - Administrator, Civil Service (Medical): No    Lack of Transportation (Non-Medical): No  Physical Activity: Not on file  Stress: Not on file  Social Connections: Not on file   Additional Social History: wants to go to services in [place] after discharge   Sleep: Fair  Appetite:  Good  Current Medications: Current Facility-Administered Medications  Medication Dose Route Frequency Provider Last Rate Last Admin   acetaminophen (TYLENOL) tablet 650 mg  650 mg Oral Q6H PRN Onuoha, Chinwendu V, NP       alum & mag hydroxide-simeth (MAALOX/MYLANTA) 200-200-20 MG/5ML suspension 30 mL  30 mL Oral Q4H PRN Onuoha, Chinwendu V, NP       buPROPion (WELLBUTRIN XL) 24 hr tablet 150 mg  150 mg Oral Daily Weber, Kyra A, NP   150 mg at 07/21/23 0855   diphenhydrAMINE (BENADRYL) capsule 50 mg  50 mg Oral TID PRN Onuoha, Chinwendu V, NP   50 mg at 07/19/23 2108   Or   diphenhydrAMINE (  BENADRYL) injection 50 mg  50 mg Intramuscular TID PRN Onuoha, Chinwendu V, NP       fluvoxaMINE (LUVOX) tablet 50 mg  50 mg Oral TID Weber, Kyra A, NP   50 mg at 07/21/23 0855   gabapentin (NEURONTIN) capsule 300 mg  300 mg Oral TID Charm Rings, NP   300 mg at 07/21/23 4098   haloperidol (HALDOL) tablet 5 mg  5 mg Oral TID PRN Onuoha, Chinwendu V, NP       Or   haloperidol lactate (HALDOL) injection 5 mg  5 mg Intramuscular TID PRN Onuoha, Chinwendu V, NP       hydrOXYzine (ATARAX) tablet 50 mg  50 mg Oral TID PRN Bennett, Christal H, NP   50 mg at 07/20/23 2111   magnesium hydroxide (MILK OF MAGNESIA) suspension 30 mL  30 mL Oral Daily  PRN Onuoha, Chinwendu V, NP       naltrexone (DEPADE) tablet 50 mg  50 mg Oral Daily Weber, Kyra A, NP   50 mg at 07/21/23 0855   nicotine (NICODERM CQ - dosed in mg/24 hours) patch 14 mg  14 mg Transdermal Daily Lewanda Rife, MD   14 mg at 07/21/23 0856   thiamine (VITAMIN B1) tablet 100 mg  100 mg Oral Daily Weber, Kyra A, NP   100 mg at 07/21/23 0855   traZODone (DESYREL) tablet 50 mg  50 mg Oral QHS Bennett, Christal H, NP   50 mg at 07/20/23 2111    Lab Results: No results found for this or any previous visit (from the past 48 hour(s)).  Blood Alcohol level:  Lab Results  Component Value Date   ETH <10 07/16/2023   ETH <10 11/20/2017    Metabolic Disorder Labs: No results found for: "HGBA1C", "MPG" No results found for: "PROLACTIN" No results found for: "CHOL", "TRIG", "HDL", "CHOLHDL", "VLDL", "LDLCALC"  Physical Findings: AIMS:  , ,  ,  ,    CIWA:  CIWA-Ar Total: 4 COWS:     Musculoskeletal: Strength & Muscle Tone: within normal limits Gait & Station: normal Patient leans: N/A  Psychiatric Specialty Exam: Physical Exam Vitals and nursing note reviewed.  Constitutional:      Appearance: Normal appearance.  HENT:     Head: Normocephalic.     Nose: Nose normal.  Pulmonary:     Effort: Pulmonary effort is normal.  Musculoskeletal:        General: Normal range of motion.     Cervical back: Normal range of motion.  Neurological:     General: No focal deficit present.     Mental Status: He is alert and oriented to person, place, and time.  Psychiatric:        Attention and Perception: Attention and perception normal.        Mood and Affect: Mood is anxious and depressed.        Speech: Speech normal.        Behavior: Behavior normal. Behavior is cooperative.        Thought Content: Thought content normal.        Cognition and Memory: Cognition and memory normal.        Judgment: Judgment normal.     Review of Systems  Psychiatric/Behavioral:  Positive  for depression and substance abuse. The patient is nervous/anxious.   All other systems reviewed and are negative.   Blood pressure 105/81, pulse 64, temperature 98.1 F (36.7 C), temperature source Oral, resp. rate 18, height  5\' 10"  (1.778 m), weight 74.8 kg, SpO2 99%.Body mass index is 23.68 kg/m.  General Appearance: Casual  Eye Contact:  Good  Speech:  Normal Rate  Volume:  Normal  Mood:  Anxious, depressed--mild  Affect:  Congruent  Thought Process:  Coherent  Orientation:  Full (Time, Place, and Person)  Thought Content:  WDL and Logical  Suicidal Thoughts:  No  Homicidal Thoughts:  No  Memory:  Immediate;   Good Recent;   Good Remote;   Good  Judgement:  Fair  Insight:  Fair  Psychomotor Activity:  Normal  Concentration:  Concentration: Good and Attention Span: Good  Recall:  Good  Fund of Knowledge:  Good  Language:  Good  Akathisia:  No  Handed:  Right  AIMS (if indicated):     Assets:  Housing Leisure Time Physical Health Resilience Social Support  ADL's:  Intact  Cognition:  WNL  Sleep:         Physical Exam: Physical Exam Vitals and nursing note reviewed.  Constitutional:      Appearance: Normal appearance.  HENT:     Head: Normocephalic.     Nose: Nose normal.  Pulmonary:     Effort: Pulmonary effort is normal.  Musculoskeletal:        General: Normal range of motion.     Cervical back: Normal range of motion.  Neurological:     General: No focal deficit present.     Mental Status: He is alert and oriented to person, place, and time.  Psychiatric:        Attention and Perception: Attention and perception normal.        Mood and Affect: Mood is anxious and depressed.        Speech: Speech normal.        Behavior: Behavior normal. Behavior is cooperative.        Thought Content: Thought content normal.        Cognition and Memory: Cognition and memory normal.        Judgment: Judgment normal.    Review of Systems  Psychiatric/Behavioral:   Positive for depression and substance abuse. The patient is nervous/anxious.   All other systems reviewed and are negative.  Blood pressure 105/81, pulse 64, temperature 98.1 F (36.7 C), temperature source Oral, resp. rate 18, height 5\' 10"  (1.778 m), weight 74.8 kg, SpO2 99%. Body mass index is 23.68 kg/m.   Treatment Plan Summary: Daily contact with patient to assess and evaluate symptoms and progress in treatment, Medication management, and Plan : Major depressive disorder, recurrent, severe without psychosis: Luvox 50 mg TID Wellbutrin XR 150 mg daily  Anxiety: Gabapentin 300 mg TID  Alcohol use d/o: Naltrexone 50 mg daily  Insomnia: Trazodone 50 mg daily at bedtime  Nanine Means, NP 07/21/2023, 11:09 AM

## 2023-07-21 NOTE — Group Note (Signed)
Date:  07/21/2023 Time:  10:04 PM  Group Topic/Focus:  Wrap-Up Group:   The focus of this group is to help patients review their daily goal of treatment and discuss progress on daily workbooks.    Participation Level:  Active  Participation Quality:  Appropriate  Affect:  Appropriate  Cognitive:  Appropriate  Insight: Good  Engagement in Group:  Supportive  Modes of Intervention:  Support  Additional Comments:     Belva Crome 07/21/2023, 10:04 PM

## 2023-07-21 NOTE — Plan of Care (Signed)
  Problem: Coping: Goal: Coping ability will improve Outcome: Progressing  Patient appears less anxious interacting appropriately.

## 2023-07-21 NOTE — Progress Notes (Signed)
   07/21/23 1100  Psych Admission Type (Psych Patients Only)  Admission Status Voluntary  Psychosocial Assessment  Patient Complaints Anxiety  Eye Contact Fair  Facial Expression Anxious  Affect Anxious  Speech Logical/coherent  Interaction Assertive  Motor Activity Slow  Appearance/Hygiene Improved  Behavior Characteristics Cooperative  Mood Anxious  Thought Process  Coherency WDL  Content WDL  Delusions None reported or observed  Perception WDL  Hallucination None reported or observed  Judgment Limited  Confusion None  Danger to Self  Current suicidal ideation? Denies  Agreement Not to Harm Self Yes  Description of Agreement verbal  Danger to Others  Danger to Others None reported or observed   Alert and oriented x4. Calm and cooperative on unit. Denies active SI/HI/AVH but patient is still obviously very anxious, continues to pace at times and verbally persevates on the future and discharge on Monday. Rates depression 4/10, hopelessness 3/10, anxiety 2/10. Denies any physical pain. Reports he slept fair last night. Q89min checks remain in place, verbal support and encouragement provided.

## 2023-07-21 NOTE — Progress Notes (Signed)
Patient alert and oriented x 4,thoughts are organized and coherent, he denies SI/HI/AVH,  he was noted not  interacting with peers and staff, affect is blunted, he appears restless, anxious and apprehensive, pacing on the unit. Patient  was offered emotional support and encouragement, and he was receptive. 15 minutes safety checks maintained will continue to  monitor closely.

## 2023-07-21 NOTE — Group Note (Unsigned)
Date:  07/21/2023 Time:  9:34 PM  Group Topic/Focus:  Wrap-Up Group:   The focus of this group is to help patients review their daily goal of treatment and discuss progress on daily workbooks.     Participation Level:  {BHH PARTICIPATION AOZHY:86578}  Participation Quality:  {BHH PARTICIPATION QUALITY:22265}  Affect:  {BHH AFFECT:22266}  Cognitive:  {BHH COGNITIVE:22267}  Insight: {BHH Insight2:20797}  Engagement in Group:  {BHH ENGAGEMENT IN IONGE:95284}  Modes of Intervention:  {BHH MODES OF INTERVENTION:22269}  Additional Comments:  ***  Belva Crome 07/21/2023, 9:34 PM

## 2023-07-22 DIAGNOSIS — F332 Major depressive disorder, recurrent severe without psychotic features: Secondary | ICD-10-CM | POA: Diagnosis not present

## 2023-07-22 MED ORDER — GABAPENTIN 300 MG PO CAPS: 300.0000 mg | ORAL_CAPSULE | Freq: Three times a day (TID) | ORAL | 0 refills | Status: AC

## 2023-07-22 MED ORDER — TRAZODONE HCL 50 MG PO TABS: 50.0000 mg | ORAL_TABLET | Freq: Every day | ORAL | 0 refills | Status: AC

## 2023-07-22 MED ORDER — FLUVOXAMINE MALEATE 50 MG PO TABS: 50.0000 mg | ORAL_TABLET | Freq: Three times a day (TID) | ORAL | 0 refills | Status: AC

## 2023-07-22 MED ORDER — BUPROPION HCL ER (XL) 150 MG PO TB24: 150.0000 mg | ORAL_TABLET | Freq: Every day | ORAL | 0 refills | Status: AC

## 2023-07-22 MED ORDER — NALTREXONE HCL 50 MG PO TABS: 50.0000 mg | ORAL_TABLET | Freq: Every day | ORAL | 0 refills | Status: AC

## 2023-07-22 MED ORDER — QUETIAPINE FUMARATE 100 MG PO TABS: 100.0000 mg | ORAL_TABLET | Freq: Every evening | ORAL | 0 refills | Status: AC | PRN

## 2023-07-22 MED ORDER — VITAMIN B-1 100 MG PO TABS: 100.0000 mg | ORAL_TABLET | Freq: Every day | ORAL | 0 refills | Status: AC

## 2023-07-22 NOTE — Group Note (Signed)
Recreation Therapy Group Note   Group Topic:Problem Solving  Group Date: 07/22/2023 Start Time: 1000 End Time: 1055 Facilitators: Rosina Lowenstein, LRT, CTRS Location:  Craft Room  Group Description: Life Boat. Patients were given the scenario that they are on a boat that is about to become shipwrecked, leaving them stranded on an [place]. They are asked to make a list of 15 different items that they want to take with them when they are stranded on the [place]. Patients are asked to rank their items from most important to least important, #1 being the most important and #15 being the least. Patients will work individually for the first round to come up with 15 items and then pair up with a peer(s) to condense their list and come up with one list of 15 items between the two of them. Patients or LRT will read aloud the 15 different items to the group after each round. LRT facilitated post-activity processing to discuss how this activity can be used in daily life post discharge.   Goal Area(s) Addressed:  Patient will identify priorities, wants and needs. Patient will communicate with LRT and peers. Patient will work collectively as a Administrator, Civil Service. Patient will work on Product manager.    Affect/Mood: Appropriate and Flat   Participation Level: Active and Engaged   Participation Quality: Independent   Behavior: Calm and Cooperative   Speech/Thought Process: Coherent   Insight: Good   Judgement: Good   Modes of Intervention: Activity   Patient Response to Interventions:  Receptive   Education Outcome:  Acknowledges education   Clinical Observations/Individualized Feedback: Grant Lowery was active in their participation of session activities and group discussion. Pt identified "bible, water, shoes, swimming goggles" as items he will bring with him on the [place]. Pt interacted well with LRT and peers duration of session.   Plan: Continue to engage patient in RT group sessions  2-3x/week.   Rosina Lowenstein, LRT, CTRS 07/22/2023 11:53 AM

## 2023-07-22 NOTE — Progress Notes (Signed)
Pt denies any suicidal ideation or homicidal ideation or AVH. Pt is pleasant and cooperative and is future oriented

## 2023-07-22 NOTE — BHH Suicide Risk Assessment (Signed)
[facility] Discharge Suicide Risk Assessment   Principal Problem: Major depressive disorder, recurrent severe without psychotic features (HCC) Discharge Diagnoses: Principal Problem:   Major depressive disorder, recurrent severe without psychotic features (HCC) Active Problems:   Alcohol use disorder, severe, dependence (HCC)   Total Time spent with patient: 1 hour  Musculoskeletal: Strength & Muscle Tone: within normal limits Gait & Station: normal Patient leans: N/A  Psychiatric Specialty Exam: Physical Exam Vitals and nursing note reviewed.  Constitutional:      Appearance: Normal appearance.  HENT:     Head: Normocephalic.     Nose: Nose normal.  Pulmonary:     Effort: Pulmonary effort is normal.  Musculoskeletal:        General: Normal range of motion.     Cervical back: Normal range of motion.  Neurological:     General: No focal deficit present.     Mental Status: He is alert and oriented to person, place, and time.     Review of Systems  All other systems reviewed and are negative.   Blood pressure 105/71, pulse 73, temperature (!) 97.2 F (36.2 C), resp. rate 18, height 5\' 10"  (1.778 m), weight 74.8 kg, SpO2 98%.Body mass index is 23.68 kg/m.  General Appearance: Casual  Eye Contact:  Good  Speech:  Normal Rate  Volume:  Normal  Mood:  "I'm good"  Affect:  Congruent  Thought Process:  Coherent and Descriptions of Associations: Intact  Orientation:  Full (Time, Place, and Person)  Thought Content:  WDL and Logical  Suicidal Thoughts:  No  Homicidal Thoughts:  No  Memory:  Immediate;   Good Recent;   Good Remote;   Good  Judgement:  Good  Insight:  Good  Psychomotor Activity:  Normal  Concentration:  Concentration: Good and Attention Span: Good  Recall:  Good  Fund of Knowledge:  Good  Language:  Good  Akathisia:  No  Handed:  Right  AIMS (if indicated):     Assets:  Leisure Time Physical Health Resilience Social Support  ADL's:  Intact   Cognition:  WNL  Sleep:        Physical Exam: Physical Exam Vitals and nursing note reviewed.  Constitutional:      Appearance: Normal appearance.  HENT:     Head: Normocephalic.     Nose: Nose normal.  Pulmonary:     Effort: Pulmonary effort is normal.  Musculoskeletal:        General: Normal range of motion.     Cervical back: Normal range of motion.  Neurological:     General: No focal deficit present.     Mental Status: He is alert and oriented to person, place, and time.    Review of Systems  All other systems reviewed and are negative.  Blood pressure 105/71, pulse 73, temperature (!) 97.2 F (36.2 C), resp. rate 18, height 5\' 10"  (1.778 m), weight 74.8 kg, SpO2 98%. Body mass index is 23.68 kg/m.  Mental Status Per Nursing Assessment::   On Admission:  NA  Demographic Factors:  Male and Caucasian  Loss Factors: NA  Historical Factors: NA  Risk Reduction Factors:   Sense of responsibility to family, Living with another person, especially a relative, Positive social support, and Positive therapeutic relationship  Continued Clinical Symptoms:  None   Cognitive Features That Contribute To Risk:  None    Suicide Risk:  Minimal: No identifiable suicidal ideation.  Patients presenting with no risk factors but with morbid ruminations; may  be classified as minimal risk based on the severity of the depressive symptoms   Plan Of Care/Follow-up recommendations:  Daily contact with patient to assess and evaluate symptoms and progress in treatment, Medication management, and Plan : Major depressive disorder, recurrent, severe without psychosis: Luvox 50 mg TID Wellbutrin XR 150 mg daily   Anxiety: Gabapentin 300 mg TID   Alcohol use d/o: Naltrexone 50 mg daily   Insomnia: Trazodone 50 mg daily at bedtime   Continue care at [facility]  Nanine Means, NP 07/22/2023, 10:13 AM

## 2023-07-22 NOTE — Group Note (Signed)
Date:  07/22/2023 Time:  10:51 AM  Group Topic/Focus:  Self Care:   The focus of this group is to help patients understand the importance of self-care in order to improve or restore emotional, physical, spiritual, interpersonal, and financial health.    Participation Level:  Did Not Attend   Rosaura Carpenter 07/22/2023, 10:51 AM

## 2023-07-22 NOTE — Progress Notes (Signed)
Patient alert and oriented x 4, thoughts are organized and coherent, he denies SI/HI/AVH, he appears elated that he is getting discharged tomorrow. Patient is interacting with peers and staff appropriately. Patient is complaint with medication regimen. Patient was offered emotional support and encouragement, and he was receptive. 15 minutes safety checks maintained will continue to  monitor closely.

## 2023-07-22 NOTE — Progress Notes (Signed)
Pt left unit with all belongings. Pt is future oriented,denies S/I H/I or Avh Escorted by staff to ride from secure transport

## 2023-07-22 NOTE — BHH Counselor (Addendum)
ADDENDUM  CSW followed up with Dr. Marval Regal regarding the below information.  Per Dr. Marval Regal, she can not provide input on patient's ability to navigate airport stating "this plan was not discussed with me during progression".  However, later stated "he has capacity or he wouldn't be discharging".   Penni Homans, MSW, LCSW 07/22/2023 1:50 PM    CSW has confirmed that patient has a bed at [facility] in [place].  CSW spoke with [facility] who confirmed.  She reports that pt will receive an email with flight details.  Pt later reported that he does not have his ID for the flight.   CSW spoke with someone at [facility] who attempted to get in contact with [employer] but was not and sent an email.  She reports that she will follow up once she hears back, staff members name is Marisue Ivan.  CSW attempted to contact TSA with the number provided by Marisue Ivan, however, there was no luck.  CSW reviewed the TSA.gov website and it states: "Forgot Your ID? In the event you arrive at the airport without valid identification, because it is lost or at home, you may still be allowed to fly. The TSA officer may ask you to complete an identity verification process which includes collecting information such as your name, current address, and other personal information to confirm your identity. If your identity is confirmed, you will be allowed to enter the screening checkpoint. You will be subject to additional screening, to include a patdown and screening of carry-on property.  You will not be allowed to enter the security checkpoint if your identity cannot be confirmed, you choose to not provide proper identification or you decline to cooperate with the identity verification process.  TSA recommends that you arrive at least two hours in advance of your flight time."  CSW reviewed this with patient who reports that he has done this process in the past.   CSW spoke with patient on alternative plan should he not pass  the verification process.  CSW pointed out that the bus does not go to the airport.  Patient reports that he will contact his AA sponsor or a peer to provide transportation to the [facility].  CSW has reached out to providers for their thoughts and are waiting to hear back.  Penni Homans, MSW, LCSW 07/22/2023 1:07 PM

## 2023-07-22 NOTE — Progress Notes (Signed)
  [facility] Adult Case Management Discharge Plan :  Will you be returning to the same living situation after discharge:  No.Patient going to residential facility in [place].  At discharge, do you have transportation home?: Yes,  CSW to provide transportation assistance to airport. Do you have the ability to pay for your medications: Yes,  BLUE CROSS BLUE SHIELD / BCBS COMM PPO  Release of information consent forms completed and in the chart;  Patient's signature needed at discharge.  Patient to Follow up at:  Follow-up Information     [facility] [place] Follow up.   Why: Your intake appointment is scheduled for 07/22/2023 Contact information: [address] [place],  [place] [address]  [phone]                Next level of care provider has access to [facility] Link:no  Safety Planning and Suicide Prevention discussed: Yes,  SPE completed with patient as patient declined collateral contactg  Have you used any form of tobacco in the last 30 days? (Cigarettes, Smokeless Tobacco, Cigars, and/or Pipes): Yes  Has patient been referred to the Quitline?: Patient refused referral for treatment  Patient has been referred for addiction treatment: Yes, referral information given appointment scheduled for 07/22/2023  Harden Mo, LCSW 07/22/2023, 1:44 PM

## 2023-07-22 NOTE — Discharge Summary (Signed)
Physician Discharge Summary Note  Patient:  Grant Lowery is an 47 y.o., male MRN:  [id number] DOB:  February 02, 1976 Patient phone:  [phone] (home)  Patient address:   [address] [place] [id number],  Total Time spent with patient: 45 minutes  Date of Admission:  07/18/2023 Date of Discharge: 07/22/2023  Reason for Admission:  depression and suicidal ideations  Principal Problem: Major depressive disorder, recurrent severe without psychotic features [facility]) Discharge Diagnoses: Principal Problem:   Major depressive disorder, recurrent severe without psychotic features (HCC) Active Problems:   Alcohol use disorder, severe, dependence (HCC)   Past Psychiatric History: depression, anxiety, alcohol use disorder, OCD  Past Medical History:  Past Medical History:  Diagnosis Date   Alcoholism (HCC)    OCD (obsessive compulsive disorder)    History reviewed. No pertinent surgical history. Family History:  Family History  Problem Relation Age of Onset   Cancer Father    Family Psychiatric  History: none Social History:  Social History   Substance and Sexual Activity  Alcohol Use No     Social History   Substance and Sexual Activity  Drug Use No    Social History   Socioeconomic History   Marital status: Divorced    Spouse name: Not on file   Number of children: Not on file   Years of education: Not on file   Highest education level: Not on file  Occupational History   Not on file  Tobacco Use   Smoking status: Some Days    Types: Cigars    Passive exposure: Current   Smokeless tobacco: Never  Vaping Use   Vaping status: Never Used  Substance and Sexual Activity   Alcohol use: No   Drug use: No   Sexual activity: Not Currently  Other Topics Concern   Not on file  Social History Narrative   Not on file   Social Determinants of Health   Financial Resource Strain: Not on file  Food Insecurity: Food Insecurity Present (07/18/2023)   Hunger Vital Sign     Worried About Running Out of Food in the Last Year: Sometimes true    Ran Out of Food in the Last Year: Sometimes true  Transportation Needs: No Transportation Needs (07/18/2023)   PRAPARE - Administrator, Civil Service (Medical): No    Lack of Transportation (Non-Medical): No  Physical Activity: Not on file  Stress: Not on file  Social Connections: Not on file    Hospital Course:   47 yo client admitted with suicidal ideations after his depression increased in alcohol rehab.  Medications adjusted and therapy initiated with symptoms resolving.  He denies suicidal/homicidal ideations, hallucinations, or withdrawal symptoms along with cravings.  Discharge instructions with explanations provided along with Rx, crisis numbers, and follow up with [facility].  Physical Findings: AIMS:  , ,  ,  ,    CIWA:  CIWA-Ar Total: 4 COWS:     Musculoskeletal: Strength & Muscle Tone: within normal limits Gait & Station: normal Patient leans: N/A  Psychiatric Specialty Exam: Physical Exam Vitals and nursing note reviewed.  Constitutional:      Appearance: Normal appearance.  HENT:     Head: Normocephalic.     Nose: Nose normal.  Pulmonary:     Effort: Pulmonary effort is normal.  Musculoskeletal:        General: Normal range of motion.     Cervical back: Normal range of motion.  Neurological:     General:  No focal deficit present.     Mental Status: He is alert and oriented to person, place, and time.     Review of Systems  All other systems reviewed and are negative.   Blood pressure 105/71, pulse 73, temperature (!) 97.2 F (36.2 C), resp. rate 18, height 5\' 10"  (1.778 m), weight 74.8 kg, SpO2 98%.Body mass index is 23.68 kg/m.  General Appearance: Casual  Eye Contact:  Good  Speech:  Normal Rate  Volume:  Normal  Mood:  "I'm good"  Affect:  Congruent  Thought Process:  Coherent and Descriptions of Associations: Intact  Orientation:  Full (Time, Place, and  Person)  Thought Content:  WDL and Logical  Suicidal Thoughts:  No  Homicidal Thoughts:  No  Memory:  Immediate;   Good Recent;   Good Remote;   Good  Judgement:  Good  Insight:  Good  Psychomotor Activity:  Normal  Concentration:  Concentration: Good and Attention Span: Good  Recall:  Good  Fund of Knowledge:  Good  Language:  Good  Akathisia:  No  Handed:  Right  AIMS (if indicated):     Assets:  Leisure Time Physical Health Resilience Social Support  ADL's:  Intact  Cognition:  WNL  Sleep:        Physical Exam: Physical Exam Vitals and nursing note reviewed.  Constitutional:      Appearance: Normal appearance.  HENT:     Head: Normocephalic.     Nose: Nose normal.  Pulmonary:     Effort: Pulmonary effort is normal.  Musculoskeletal:        General: Normal range of motion.     Cervical back: Normal range of motion.  Neurological:     General: No focal deficit present.     Mental Status: He is alert and oriented to person, place, and time.  Psychiatric:        Attention and Perception: Attention and perception normal.        Mood and Affect: Mood and affect normal.        Speech: Speech normal.        Behavior: Behavior normal. Behavior is cooperative.        Thought Content: Thought content normal.        Cognition and Memory: Cognition and memory normal.        Judgment: Judgment normal.    Review of Systems  Psychiatric/Behavioral:  Positive for substance abuse.   All other systems reviewed and are negative.  Blood pressure 105/71, pulse 73, temperature (!) 97.2 F (36.2 C), resp. rate 18, height 5\' 10"  (1.778 m), weight 74.8 kg, SpO2 98%. Body mass index is 23.68 kg/m.   Social History   Tobacco Use  Smoking Status Some Days   Types: Cigars   Passive exposure: Current  Smokeless Tobacco Never   Tobacco Cessation:  A prescription for an FDA-approved tobacco cessation medication was offered at discharge and the patient refused   Blood  Alcohol level:  Lab Results  Component Value Date   ETH <10 07/16/2023   ETH <10 11/20/2017    Metabolic Disorder Labs:  No results found for: "HGBA1C", "MPG" No results found for: "PROLACTIN" No results found for: "CHOL", "TRIG", "HDL", "CHOLHDL", "VLDL", "LDLCALC"  See Psychiatric Specialty Exam and Suicide Risk Assessment completed by Attending Physician prior to discharge.  Discharge destination:  Other:  [facility]  Is patient on multiple antipsychotic therapies at discharge:  No   Has Patient had three  or more failed trials of antipsychotic monotherapy by history:  No  Recommended Plan for Multiple Antipsychotic Therapies: NA  Discharge Instructions     Diet - low sodium heart healthy   Complete by: As directed    Discharge instructions   Complete by: As directed    Follow up with [facility]   Increase activity slowly   Complete by: As directed       Allergies as of 07/22/2023   No Known Allergies      Medication List     STOP taking these medications    GNP Pain Relief 325 MG tablet Generic drug: acetaminophen   naproxen 500 MG tablet Commonly known as: NAPROSYN   thiamine 100 MG tablet Commonly known as: VITAMIN B1 Replaced by: thiamine 100 MG tablet       TAKE these medications      Indication  buPROPion 150 MG 24 hr tablet Commonly known as: WELLBUTRIN XL Take 1 tablet (150 mg total) by mouth daily. Start taking on: July 23, 2023  Indication: Major Depressive Disorder   fluvoxaMINE 50 MG tablet Commonly known as: LUVOX Take 1 tablet (50 mg total) by mouth 3 (three) times daily.  Indication: Major Depressive Disorder   gabapentin 300 MG capsule Commonly known as: NEURONTIN Take 1 capsule (300 mg total) by mouth 3 (three) times daily.  Indication: Abuse or Misuse of Alcohol, and anxiety   naltrexone 50 MG tablet Commonly known as: DEPADE Take 1 tablet (50 mg total) by mouth daily. Start taking on: July 23, 2023   Indication: Abuse or Misuse of Alcohol   QUEtiapine 100 MG tablet Commonly known as: SEROQUEL Take 1 tablet (100 mg total) by mouth at bedtime as needed.  Indication: mood stabilization   thiamine 100 MG tablet Commonly known as: Vitamin B-1 Take 1 tablet (100 mg total) by mouth daily. Start taking on: July 23, 2023 Replaces: thiamine 100 MG tablet  Indication: Deficiency of Vitamin B1   traZODone 50 MG tablet Commonly known as: DESYREL Take 1 tablet (50 mg total) by mouth at bedtime.  Indication: Trouble Sleeping         Follow-up recommendations:   Daily contact with patient to assess and evaluate symptoms and progress in treatment, Medication management, and Plan : Major depressive disorder, recurrent, severe without psychosis: Luvox 50 mg TID Wellbutrin XR 150 mg daily   Anxiety: Gabapentin 300 mg TID   Alcohol use d/o: Naltrexone 50 mg daily   Insomnia: Trazodone 50 mg daily at bedtime    Continue care at [facility]   Signed: Nanine Means, NP 07/22/2023, 10:53 AM
